# Patient Record
Sex: Female | Born: 1950 | Race: White | Hispanic: No | Marital: Single | Smoking: Current every day smoker
Health system: Southern US, Community
[De-identification: ages and names within clinical notes are randomized; demographics above are authoritative.]

---

## 2004-10-02 ENCOUNTER — Ambulatory Visit: Payer: Self-pay | Admitting: Family Medicine

## 2005-04-26 ENCOUNTER — Emergency Department: Payer: Self-pay | Admitting: Emergency Medicine

## 2008-10-16 ENCOUNTER — Ambulatory Visit: Payer: Self-pay

## 2009-04-16 ENCOUNTER — Ambulatory Visit: Payer: Self-pay

## 2014-09-19 ENCOUNTER — Ambulatory Visit: Payer: Self-pay | Admitting: Family Medicine

## 2015-05-07 ENCOUNTER — Other Ambulatory Visit: Payer: Self-pay | Admitting: Family Medicine

## 2015-05-07 DIAGNOSIS — Z1231 Encounter for screening mammogram for malignant neoplasm of breast: Secondary | ICD-10-CM

## 2015-09-22 ENCOUNTER — Ambulatory Visit: Payer: Self-pay

## 2015-09-25 ENCOUNTER — Ambulatory Visit
Admission: RE | Admit: 2015-09-25 | Discharge: 2015-09-25 | Disposition: A | Discharge status: Home / Self Care | Payer: Medicare Other | Source: Ambulatory Visit | Attending: Family Medicine | Admitting: Family Medicine

## 2015-09-25 DIAGNOSIS — Z1231 Encounter for screening mammogram for malignant neoplasm of breast: Secondary | ICD-10-CM | POA: Diagnosis present

## 2015-11-26 ENCOUNTER — Other Ambulatory Visit: Payer: Self-pay | Admitting: Family Medicine

## 2015-11-26 DIAGNOSIS — Z13820 Encounter for screening for osteoporosis: Secondary | ICD-10-CM

## 2016-09-06 DIAGNOSIS — Z0000 Encounter for general adult medical examination without abnormal findings: Secondary | ICD-10-CM | POA: Diagnosis not present

## 2016-09-06 DIAGNOSIS — E7800 Pure hypercholesterolemia, unspecified: Secondary | ICD-10-CM | POA: Diagnosis not present

## 2016-09-06 DIAGNOSIS — F526 Dyspareunia not due to a substance or known physiological condition: Secondary | ICD-10-CM | POA: Diagnosis not present

## 2016-09-06 DIAGNOSIS — Z13820 Encounter for screening for osteoporosis: Secondary | ICD-10-CM | POA: Diagnosis not present

## 2016-09-06 DIAGNOSIS — Z23 Encounter for immunization: Secondary | ICD-10-CM | POA: Diagnosis not present

## 2016-09-06 DIAGNOSIS — Z1231 Encounter for screening mammogram for malignant neoplasm of breast: Secondary | ICD-10-CM | POA: Diagnosis not present

## 2016-09-06 DIAGNOSIS — Z87891 Personal history of nicotine dependence: Secondary | ICD-10-CM | POA: Diagnosis not present

## 2016-09-06 DIAGNOSIS — R87610 Atypical squamous cells of undetermined significance on cytologic smear of cervix (ASC-US): Secondary | ICD-10-CM | POA: Diagnosis not present

## 2016-09-06 DIAGNOSIS — D229 Melanocytic nevi, unspecified: Secondary | ICD-10-CM | POA: Diagnosis not present

## 2016-09-07 ENCOUNTER — Other Ambulatory Visit: Payer: Self-pay | Admitting: Family Medicine

## 2016-09-07 DIAGNOSIS — Z1231 Encounter for screening mammogram for malignant neoplasm of breast: Secondary | ICD-10-CM

## 2016-09-16 DIAGNOSIS — B977 Papillomavirus as the cause of diseases classified elsewhere: Secondary | ICD-10-CM | POA: Diagnosis not present

## 2016-09-16 DIAGNOSIS — N941 Unspecified dyspareunia: Secondary | ICD-10-CM | POA: Diagnosis not present

## 2016-10-05 DIAGNOSIS — L570 Actinic keratosis: Secondary | ICD-10-CM | POA: Diagnosis not present

## 2016-10-05 DIAGNOSIS — L821 Other seborrheic keratosis: Secondary | ICD-10-CM | POA: Diagnosis not present

## 2016-10-05 DIAGNOSIS — L820 Inflamed seborrheic keratosis: Secondary | ICD-10-CM | POA: Diagnosis not present

## 2016-10-05 DIAGNOSIS — D229 Melanocytic nevi, unspecified: Secondary | ICD-10-CM | POA: Diagnosis not present

## 2016-10-21 DIAGNOSIS — N941 Unspecified dyspareunia: Secondary | ICD-10-CM | POA: Diagnosis not present

## 2016-10-21 DIAGNOSIS — N83201 Unspecified ovarian cyst, right side: Secondary | ICD-10-CM | POA: Diagnosis not present

## 2016-10-27 ENCOUNTER — Ambulatory Visit
Admission: RE | Admit: 2016-10-27 | Discharge: 2016-10-27 | Disposition: A | Discharge status: Home / Self Care | Payer: Medicare Other | Source: Ambulatory Visit | Attending: Family Medicine | Admitting: Family Medicine

## 2016-10-27 DIAGNOSIS — Z1231 Encounter for screening mammogram for malignant neoplasm of breast: Secondary | ICD-10-CM | POA: Diagnosis not present

## 2016-10-27 DIAGNOSIS — Z13820 Encounter for screening for osteoporosis: Secondary | ICD-10-CM | POA: Insufficient documentation

## 2016-10-27 DIAGNOSIS — M810 Age-related osteoporosis without current pathological fracture: Secondary | ICD-10-CM | POA: Insufficient documentation

## 2016-10-27 DIAGNOSIS — M818 Other osteoporosis without current pathological fracture: Secondary | ICD-10-CM | POA: Diagnosis not present

## 2016-11-09 DIAGNOSIS — R05 Cough: Secondary | ICD-10-CM | POA: Diagnosis not present

## 2016-11-09 DIAGNOSIS — J22 Unspecified acute lower respiratory infection: Secondary | ICD-10-CM | POA: Diagnosis not present

## 2016-11-09 DIAGNOSIS — Z87891 Personal history of nicotine dependence: Secondary | ICD-10-CM | POA: Diagnosis not present

## 2016-11-09 DIAGNOSIS — M810 Age-related osteoporosis without current pathological fracture: Secondary | ICD-10-CM | POA: Diagnosis not present

## 2016-12-09 DIAGNOSIS — Z87891 Personal history of nicotine dependence: Secondary | ICD-10-CM | POA: Diagnosis not present

## 2016-12-09 DIAGNOSIS — R05 Cough: Secondary | ICD-10-CM | POA: Diagnosis not present

## 2016-12-09 DIAGNOSIS — R938 Abnormal findings on diagnostic imaging of other specified body structures: Secondary | ICD-10-CM | POA: Diagnosis not present

## 2017-01-03 DIAGNOSIS — R938 Abnormal findings on diagnostic imaging of other specified body structures: Secondary | ICD-10-CM | POA: Diagnosis not present

## 2017-01-03 DIAGNOSIS — N83201 Unspecified ovarian cyst, right side: Secondary | ICD-10-CM | POA: Diagnosis not present

## 2017-05-18 DIAGNOSIS — Z23 Encounter for immunization: Secondary | ICD-10-CM | POA: Diagnosis not present

## 2017-06-06 DIAGNOSIS — J209 Acute bronchitis, unspecified: Secondary | ICD-10-CM | POA: Diagnosis not present

## 2017-08-04 DIAGNOSIS — N83201 Unspecified ovarian cyst, right side: Secondary | ICD-10-CM | POA: Diagnosis not present

## 2017-09-08 DIAGNOSIS — M810 Age-related osteoporosis without current pathological fracture: Secondary | ICD-10-CM | POA: Diagnosis not present

## 2017-09-08 DIAGNOSIS — B029 Zoster without complications: Secondary | ICD-10-CM | POA: Diagnosis not present

## 2017-09-08 DIAGNOSIS — Z0000 Encounter for general adult medical examination without abnormal findings: Secondary | ICD-10-CM | POA: Diagnosis not present

## 2017-09-08 DIAGNOSIS — Z87891 Personal history of nicotine dependence: Secondary | ICD-10-CM | POA: Diagnosis not present

## 2017-09-16 ENCOUNTER — Other Ambulatory Visit: Payer: Self-pay | Admitting: Family Medicine

## 2017-09-16 DIAGNOSIS — Z1231 Encounter for screening mammogram for malignant neoplasm of breast: Secondary | ICD-10-CM

## 2017-10-28 ENCOUNTER — Ambulatory Visit
Admission: RE | Admit: 2017-10-28 | Discharge: 2017-10-28 | Disposition: A | Discharge status: Home / Self Care | Payer: Medicare Other | Source: Ambulatory Visit | Attending: Family Medicine | Admitting: Family Medicine

## 2017-10-28 DIAGNOSIS — R921 Mammographic calcification found on diagnostic imaging of breast: Secondary | ICD-10-CM | POA: Insufficient documentation

## 2017-10-28 DIAGNOSIS — Z1231 Encounter for screening mammogram for malignant neoplasm of breast: Secondary | ICD-10-CM | POA: Diagnosis not present

## 2017-11-01 ENCOUNTER — Other Ambulatory Visit: Payer: Self-pay | Admitting: Family Medicine

## 2017-11-01 DIAGNOSIS — R921 Mammographic calcification found on diagnostic imaging of breast: Secondary | ICD-10-CM

## 2017-11-01 DIAGNOSIS — R928 Other abnormal and inconclusive findings on diagnostic imaging of breast: Secondary | ICD-10-CM

## 2017-11-08 ENCOUNTER — Ambulatory Visit
Admission: RE | Admit: 2017-11-08 | Discharge: 2017-11-08 | Disposition: A | Discharge status: Home / Self Care | Payer: Medicare Other | Source: Ambulatory Visit | Attending: Family Medicine | Admitting: Family Medicine

## 2017-11-08 DIAGNOSIS — R921 Mammographic calcification found on diagnostic imaging of breast: Secondary | ICD-10-CM

## 2017-11-08 DIAGNOSIS — R928 Other abnormal and inconclusive findings on diagnostic imaging of breast: Secondary | ICD-10-CM | POA: Insufficient documentation

## 2017-11-14 ENCOUNTER — Other Ambulatory Visit: Payer: Self-pay | Admitting: Family Medicine

## 2017-11-14 DIAGNOSIS — N6320 Unspecified lump in the left breast, unspecified quadrant: Secondary | ICD-10-CM | POA: Diagnosis not present

## 2017-11-14 DIAGNOSIS — R928 Other abnormal and inconclusive findings on diagnostic imaging of breast: Secondary | ICD-10-CM | POA: Diagnosis not present

## 2017-11-14 DIAGNOSIS — M810 Age-related osteoporosis without current pathological fracture: Secondary | ICD-10-CM | POA: Diagnosis not present

## 2017-11-14 DIAGNOSIS — R921 Mammographic calcification found on diagnostic imaging of breast: Secondary | ICD-10-CM

## 2017-11-14 DIAGNOSIS — Z87891 Personal history of nicotine dependence: Secondary | ICD-10-CM | POA: Diagnosis not present

## 2017-11-15 ENCOUNTER — Telehealth: Payer: Self-pay | Admitting: *Deleted

## 2017-11-15 NOTE — Telephone Encounter (Signed)
Received referral for initial lung cancer screening scan. Contacted patient who is having breast finding evaluated. Discussed screening with patient and will follow up after procedure for breast biopsy.

## 2017-11-22 ENCOUNTER — Ambulatory Visit: Payer: Medicare Other

## 2017-12-01 ENCOUNTER — Ambulatory Visit
Admission: RE | Admit: 2017-12-01 | Discharge: 2017-12-01 | Disposition: A | Discharge status: Home / Self Care | Payer: Medicare Other | Source: Ambulatory Visit | Attending: Family Medicine | Admitting: Family Medicine

## 2017-12-01 DIAGNOSIS — R921 Mammographic calcification found on diagnostic imaging of breast: Secondary | ICD-10-CM | POA: Diagnosis not present

## 2017-12-01 DIAGNOSIS — N6012 Diffuse cystic mastopathy of left breast: Secondary | ICD-10-CM | POA: Diagnosis not present

## 2017-12-01 DIAGNOSIS — R928 Other abnormal and inconclusive findings on diagnostic imaging of breast: Secondary | ICD-10-CM | POA: Insufficient documentation

## 2017-12-01 HISTORY — PX: BREAST BIOPSY: SHX20

## 2017-12-02 LAB — SURGICAL PATHOLOGY

## 2017-12-07 ENCOUNTER — Telehealth: Payer: Self-pay | Admitting: *Deleted

## 2017-12-07 DIAGNOSIS — Z87891 Personal history of nicotine dependence: Secondary | ICD-10-CM

## 2017-12-07 DIAGNOSIS — Z122 Encounter for screening for malignant neoplasm of respiratory organs: Secondary | ICD-10-CM

## 2017-12-07 NOTE — Telephone Encounter (Signed)
Received referral for low dose lung cancer screening CT scan. Message left at phone number listed in EMR for patient to call me back to facilitate scheduling scan.  

## 2017-12-07 NOTE — Telephone Encounter (Signed)
Received referral for initial lung cancer screening scan. Contacted patient and obtained smoking history,(current, 42.75 pack year) as well as answering questions related to screening process. Patient denies signs of lung cancer such as weight loss or hemoptysis. Patient denies comorbidity that would prevent curative treatment if lung cancer were found. Patient is scheduled for shared decision making visit and CT scan on 12/27/17.

## 2017-12-27 ENCOUNTER — Ambulatory Visit
Admission: RE | Admit: 2017-12-27 | Discharge: 2017-12-27 | Disposition: A | Discharge status: Home / Self Care | Payer: Medicare Other | Source: Ambulatory Visit | Attending: Oncology | Admitting: Oncology

## 2017-12-27 ENCOUNTER — Inpatient Hospital Stay: Payer: Medicare Other | Attending: Oncology | Admitting: Oncology

## 2017-12-27 ENCOUNTER — Encounter: Payer: Self-pay | Admitting: Oncology

## 2017-12-27 DIAGNOSIS — F17210 Nicotine dependence, cigarettes, uncomplicated: Secondary | ICD-10-CM | POA: Diagnosis not present

## 2017-12-27 DIAGNOSIS — I2510 Atherosclerotic heart disease of native coronary artery without angina pectoris: Secondary | ICD-10-CM | POA: Insufficient documentation

## 2017-12-27 DIAGNOSIS — J439 Emphysema, unspecified: Secondary | ICD-10-CM | POA: Diagnosis not present

## 2017-12-27 DIAGNOSIS — I700 Atherosclerosis of aorta: Secondary | ICD-10-CM | POA: Diagnosis not present

## 2017-12-27 DIAGNOSIS — Z87891 Personal history of nicotine dependence: Secondary | ICD-10-CM

## 2017-12-27 DIAGNOSIS — Z122 Encounter for screening for malignant neoplasm of respiratory organs: Secondary | ICD-10-CM | POA: Diagnosis present

## 2017-12-27 NOTE — Progress Notes (Signed)
In accordance with CMS guidelines, patient has met eligibility criteria including age, absence of signs or symptoms of lung cancer.  Social History   Tobacco Use  . Smoking status: Current Every Day Smoker    Packs/day: 0.75    Years: 57.00    Pack years: 42.75    Types: Cigarettes  Substance Use Topics  . Alcohol use: Not on file  . Drug use: Not on file     A shared decision-making session was conducted prior to the performance of CT scan. This includes one or more decision aids, includes benefits and harms of screening, follow-up diagnostic testing, over-diagnosis, false positive rate, and total radiation exposure.  Counseling on the importance of adherence to annual lung cancer LDCT screening, impact of co-morbidities, and ability or willingness to undergo diagnosis and treatment is imperative for compliance of the program.  Counseling on the importance of continued smoking cessation for former smokers; the importance of smoking cessation for current smokers, and information about tobacco cessation interventions have been given to patient including  Quit Smart and 1800 quit [place] programs.  Written order for lung cancer screening with LDCT has been given to the patient and any and all questions have been answered to the best of my abilities.   Yearly follow up will be coordinated by Shawn Perkins, Thoracic Navigator.  Jennifer Burns, NP 12/27/2017 11:09 AM  

## 2018-01-02 ENCOUNTER — Encounter: Payer: Self-pay | Admitting: *Deleted

## 2018-01-31 DIAGNOSIS — J431 Panlobular emphysema: Secondary | ICD-10-CM | POA: Diagnosis not present

## 2018-01-31 DIAGNOSIS — M810 Age-related osteoporosis without current pathological fracture: Secondary | ICD-10-CM | POA: Diagnosis not present

## 2018-01-31 DIAGNOSIS — Z87891 Personal history of nicotine dependence: Secondary | ICD-10-CM | POA: Diagnosis not present

## 2018-01-31 DIAGNOSIS — I2510 Atherosclerotic heart disease of native coronary artery without angina pectoris: Secondary | ICD-10-CM | POA: Diagnosis not present

## 2018-05-31 DIAGNOSIS — Z23 Encounter for immunization: Secondary | ICD-10-CM | POA: Diagnosis not present

## 2018-06-08 DIAGNOSIS — R3989 Other symptoms and signs involving the genitourinary system: Secondary | ICD-10-CM | POA: Diagnosis not present

## 2018-06-08 DIAGNOSIS — N3000 Acute cystitis without hematuria: Secondary | ICD-10-CM | POA: Diagnosis not present

## 2018-06-08 DIAGNOSIS — M533 Sacrococcygeal disorders, not elsewhere classified: Secondary | ICD-10-CM | POA: Diagnosis not present

## 2018-06-13 DIAGNOSIS — R399 Unspecified symptoms and signs involving the genitourinary system: Secondary | ICD-10-CM | POA: Diagnosis not present

## 2018-06-13 DIAGNOSIS — R319 Hematuria, unspecified: Secondary | ICD-10-CM | POA: Diagnosis not present

## 2018-06-13 DIAGNOSIS — R1032 Left lower quadrant pain: Secondary | ICD-10-CM | POA: Diagnosis not present

## 2018-06-14 DIAGNOSIS — R1032 Left lower quadrant pain: Secondary | ICD-10-CM | POA: Diagnosis not present

## 2018-06-14 DIAGNOSIS — R319 Hematuria, unspecified: Secondary | ICD-10-CM | POA: Diagnosis not present

## 2018-08-03 DIAGNOSIS — I2583 Coronary atherosclerosis due to lipid rich plaque: Secondary | ICD-10-CM | POA: Diagnosis not present

## 2018-08-03 DIAGNOSIS — K635 Polyp of colon: Secondary | ICD-10-CM | POA: Diagnosis not present

## 2018-08-03 DIAGNOSIS — I2510 Atherosclerotic heart disease of native coronary artery without angina pectoris: Secondary | ICD-10-CM | POA: Diagnosis not present

## 2018-08-03 DIAGNOSIS — M810 Age-related osteoporosis without current pathological fracture: Secondary | ICD-10-CM | POA: Diagnosis not present

## 2018-08-03 DIAGNOSIS — Z1231 Encounter for screening mammogram for malignant neoplasm of breast: Secondary | ICD-10-CM | POA: Diagnosis not present

## 2018-08-03 DIAGNOSIS — J431 Panlobular emphysema: Secondary | ICD-10-CM | POA: Diagnosis not present

## 2018-08-24 ENCOUNTER — Other Ambulatory Visit: Payer: Self-pay | Admitting: Family Medicine

## 2018-08-24 DIAGNOSIS — Z1231 Encounter for screening mammogram for malignant neoplasm of breast: Secondary | ICD-10-CM

## 2018-08-31 DIAGNOSIS — R399 Unspecified symptoms and signs involving the genitourinary system: Secondary | ICD-10-CM | POA: Diagnosis not present

## 2018-09-04 DIAGNOSIS — R140 Abdominal distension (gaseous): Secondary | ICD-10-CM | POA: Diagnosis not present

## 2018-09-04 DIAGNOSIS — I70209 Unspecified atherosclerosis of native arteries of extremities, unspecified extremity: Secondary | ICD-10-CM | POA: Diagnosis not present

## 2018-09-04 DIAGNOSIS — I700 Atherosclerosis of aorta: Secondary | ICD-10-CM | POA: Diagnosis not present

## 2018-09-04 DIAGNOSIS — R1032 Left lower quadrant pain: Secondary | ICD-10-CM | POA: Diagnosis not present

## 2018-09-04 DIAGNOSIS — K5730 Diverticulosis of large intestine without perforation or abscess without bleeding: Secondary | ICD-10-CM | POA: Diagnosis not present

## 2018-09-04 DIAGNOSIS — I708 Atherosclerosis of other arteries: Secondary | ICD-10-CM | POA: Diagnosis not present

## 2018-09-04 DIAGNOSIS — K7689 Other specified diseases of liver: Secondary | ICD-10-CM | POA: Diagnosis not present

## 2018-09-04 DIAGNOSIS — K635 Polyp of colon: Secondary | ICD-10-CM | POA: Diagnosis not present

## 2018-10-02 DIAGNOSIS — I2583 Coronary atherosclerosis due to lipid rich plaque: Secondary | ICD-10-CM | POA: Diagnosis not present

## 2018-10-02 DIAGNOSIS — I2510 Atherosclerotic heart disease of native coronary artery without angina pectoris: Secondary | ICD-10-CM | POA: Diagnosis not present

## 2018-10-19 ENCOUNTER — Other Ambulatory Visit: Payer: Self-pay | Admitting: Family Medicine

## 2018-10-19 DIAGNOSIS — M810 Age-related osteoporosis without current pathological fracture: Secondary | ICD-10-CM

## 2019-01-04 ENCOUNTER — Ambulatory Visit
Admission: RE | Admit: 2019-01-04 | Discharge: 2019-01-04 | Disposition: A | Discharge status: Home / Self Care | Payer: Medicare Other | Source: Ambulatory Visit | Attending: Family Medicine | Admitting: Family Medicine

## 2019-01-04 ENCOUNTER — Other Ambulatory Visit: Payer: Self-pay

## 2019-01-04 DIAGNOSIS — Z1231 Encounter for screening mammogram for malignant neoplasm of breast: Secondary | ICD-10-CM | POA: Diagnosis not present

## 2019-01-08 DIAGNOSIS — E7800 Pure hypercholesterolemia, unspecified: Secondary | ICD-10-CM | POA: Diagnosis not present

## 2019-01-08 DIAGNOSIS — R87613 High grade squamous intraepithelial lesion on cytologic smear of cervix (HGSIL): Secondary | ICD-10-CM | POA: Diagnosis not present

## 2019-01-08 DIAGNOSIS — Z0000 Encounter for general adult medical examination without abnormal findings: Secondary | ICD-10-CM | POA: Diagnosis not present

## 2019-01-08 DIAGNOSIS — Z23 Encounter for immunization: Secondary | ICD-10-CM | POA: Diagnosis not present

## 2019-01-12 ENCOUNTER — Telehealth: Payer: Self-pay

## 2019-01-12 NOTE — Telephone Encounter (Signed)
Call pt regarding lung screening. Pt want to speak with Shawn before scheduling.

## 2019-01-15 ENCOUNTER — Telehealth: Payer: Self-pay | Admitting: *Deleted

## 2019-01-15 DIAGNOSIS — Z87891 Personal history of nicotine dependence: Secondary | ICD-10-CM

## 2019-01-15 DIAGNOSIS — Z122 Encounter for screening for malignant neoplasm of respiratory organs: Secondary | ICD-10-CM

## 2019-01-15 NOTE — Telephone Encounter (Signed)
Patient has been notified that annual lung cancer screening low dose CT scan is due currently or will be in near future. Confirmed that patient is within the age range of 55-77, and asymptomatic, (no signs or symptoms of lung cancer). Patient denies illness that would prevent curative treatment for lung cancer if found. Verified smoking history, (current, 43.5 pack year). The shared decision making visit was done 12/27/17. Patient is agreeable for CT scan being scheduled.

## 2019-01-22 ENCOUNTER — Other Ambulatory Visit: Payer: Self-pay

## 2019-01-22 ENCOUNTER — Ambulatory Visit
Admission: RE | Admit: 2019-01-22 | Discharge: 2019-01-22 | Disposition: A | Discharge status: Home / Self Care | Payer: Medicare Other | Source: Ambulatory Visit | Attending: Nurse Practitioner | Admitting: Nurse Practitioner

## 2019-01-22 DIAGNOSIS — F17210 Nicotine dependence, cigarettes, uncomplicated: Secondary | ICD-10-CM | POA: Diagnosis not present

## 2019-01-22 DIAGNOSIS — Z87891 Personal history of nicotine dependence: Secondary | ICD-10-CM | POA: Insufficient documentation

## 2019-01-22 DIAGNOSIS — Z122 Encounter for screening for malignant neoplasm of respiratory organs: Secondary | ICD-10-CM | POA: Diagnosis not present

## 2019-01-24 ENCOUNTER — Encounter: Payer: Self-pay | Admitting: *Deleted

## 2019-02-07 DIAGNOSIS — Z01812 Encounter for preprocedural laboratory examination: Secondary | ICD-10-CM | POA: Diagnosis not present

## 2019-02-07 DIAGNOSIS — Z1159 Encounter for screening for other viral diseases: Secondary | ICD-10-CM | POA: Diagnosis not present

## 2019-02-09 DIAGNOSIS — D369 Benign neoplasm, unspecified site: Secondary | ICD-10-CM | POA: Diagnosis not present

## 2019-02-09 DIAGNOSIS — E785 Hyperlipidemia, unspecified: Secondary | ICD-10-CM | POA: Diagnosis not present

## 2019-02-09 DIAGNOSIS — Z79899 Other long term (current) drug therapy: Secondary | ICD-10-CM | POA: Diagnosis not present

## 2019-02-09 DIAGNOSIS — K635 Polyp of colon: Secondary | ICD-10-CM | POA: Diagnosis not present

## 2019-02-09 DIAGNOSIS — K5730 Diverticulosis of large intestine without perforation or abscess without bleeding: Secondary | ICD-10-CM | POA: Diagnosis not present

## 2019-02-09 DIAGNOSIS — D122 Benign neoplasm of ascending colon: Secondary | ICD-10-CM | POA: Diagnosis not present

## 2019-02-09 DIAGNOSIS — Z1211 Encounter for screening for malignant neoplasm of colon: Secondary | ICD-10-CM | POA: Diagnosis not present

## 2019-02-09 DIAGNOSIS — Z86010 Personal history of colonic polyps: Secondary | ICD-10-CM | POA: Diagnosis not present

## 2019-02-09 DIAGNOSIS — D124 Benign neoplasm of descending colon: Secondary | ICD-10-CM | POA: Diagnosis not present

## 2019-02-09 DIAGNOSIS — Z791 Long term (current) use of non-steroidal anti-inflammatories (NSAID): Secondary | ICD-10-CM | POA: Diagnosis not present

## 2019-02-09 DIAGNOSIS — Z7982 Long term (current) use of aspirin: Secondary | ICD-10-CM | POA: Diagnosis not present

## 2019-06-25 DIAGNOSIS — Z23 Encounter for immunization: Secondary | ICD-10-CM | POA: Diagnosis not present

## 2019-09-19 DIAGNOSIS — I2510 Atherosclerotic heart disease of native coronary artery without angina pectoris: Secondary | ICD-10-CM | POA: Diagnosis not present

## 2019-09-19 DIAGNOSIS — Z87891 Personal history of nicotine dependence: Secondary | ICD-10-CM | POA: Diagnosis not present

## 2019-09-19 DIAGNOSIS — F5104 Psychophysiologic insomnia: Secondary | ICD-10-CM | POA: Diagnosis not present

## 2019-09-19 DIAGNOSIS — R399 Unspecified symptoms and signs involving the genitourinary system: Secondary | ICD-10-CM | POA: Diagnosis not present

## 2019-09-19 DIAGNOSIS — I2583 Coronary atherosclerosis due to lipid rich plaque: Secondary | ICD-10-CM | POA: Diagnosis not present

## 2019-12-17 ENCOUNTER — Other Ambulatory Visit: Payer: Self-pay

## 2019-12-17 ENCOUNTER — Ambulatory Visit
Admission: EM | Admit: 2019-12-17 | Discharge: 2019-12-17 | Disposition: A | Discharge status: Home / Self Care | Payer: Medicare Other | Attending: Family Medicine | Admitting: Family Medicine

## 2019-12-17 DIAGNOSIS — R319 Hematuria, unspecified: Secondary | ICD-10-CM | POA: Diagnosis not present

## 2019-12-17 DIAGNOSIS — N390 Urinary tract infection, site not specified: Secondary | ICD-10-CM | POA: Insufficient documentation

## 2019-12-17 DIAGNOSIS — B373 Candidiasis of vulva and vagina: Secondary | ICD-10-CM | POA: Insufficient documentation

## 2019-12-17 DIAGNOSIS — B3731 Acute candidiasis of vulva and vagina: Secondary | ICD-10-CM

## 2019-12-17 LAB — URINALYSIS, COMPLETE (UACMP) WITH MICROSCOPIC
Bilirubin Urine: NEGATIVE
Glucose, UA: NEGATIVE mg/dL
Ketones, ur: NEGATIVE mg/dL
Nitrite: NEGATIVE
Protein, ur: 30 mg/dL — AB
Specific Gravity, Urine: 1.015 (ref 1.005–1.030)
Squamous Epithelial / LPF: NONE SEEN (ref 0–5)
pH: >9 — ABNORMAL HIGH (ref 5.0–8.0)

## 2019-12-17 MED ORDER — CEPHALEXIN 500 MG PO CAPS: 500.0000 mg | ORAL_CAPSULE | Freq: Two times a day (BID) | ORAL | 0 refills | Status: AC

## 2019-12-17 MED ORDER — FLUCONAZOLE 150 MG PO TABS: 150.0000 mg | ORAL_TABLET | Freq: Every day | ORAL | 0 refills | Status: DC

## 2019-12-17 NOTE — ED Provider Notes (Signed)
[facility] ____________________________________________  Time seen: Approximately 10:59 AM  I have reviewed the triage vital signs and the nursing notes.   HISTORY  Chief Complaint Dysuria   HPI ALANY BORMAN is a 69 y.o. female presenting for evaluation of urinary frequency, urinary urgency and burning with urination since yesterday.  Also noticed foul odor to urine.  States this feels like previous UTIs.  Denies vaginal discharge or itching.  States some pressure in lower mid abdomen, denies other abdominal pain.  Denies fevers, vomiting, diarrhea, back pain.  Continues eat and drink well.  Denies aggravating or alleviating factors.  Cathie Hoops, [place]: PCP   No past medical history on file.  There are no problems to display for this patient.   Past Surgical History:  Procedure Laterality Date  . BREAST BIOPSY Left 12/01/2017   FIBROADENOMATOUS CHANGE WITH CALCIFICATIONS     No current facility-administered medications for this encounter.  Current Outpatient Medications:  .  aspirin 81 MG EC tablet, Take by mouth., Disp: , Rfl:  .  buPROPion (WELLBUTRIN XL) 150 MG 24 hr tablet, Take 150 mg by mouth daily., Disp: , Rfl:  .  cephALEXin (KEFLEX) 500 MG capsule, Take 1 capsule (500 mg total) by mouth 2 (two) times daily for 7 days., Disp: 14 capsule, Rfl: 0 .  fluconazole (DIFLUCAN) 150 MG tablet, Take 1 tablet (150 mg total) by mouth daily. Take one pill orally, then Repeat in one week as needed., Disp: 2 tablet, Rfl: 0  Allergies Patient has no known allergies.  Family History  Problem Relation Age of Onset  . Lung cancer Father   . Breast cancer Sister 1  . Lung cancer Brother   . Hodgkin's lymphoma Brother   . Breast cancer Sister 80    Social History Social History   Tobacco Use  . Smoking status: Current Every Day Smoker    Packs/day: 0.75    Years: 57.00    Pack years: 42.75    Types: Cigarettes  Substance Use Topics  . Alcohol  use: Not on file  . Drug use: Not on file    Review of Systems Constitutional: No fever Cardiovascular: Denies chest pain. Respiratory: Denies shortness of breath. Gastrointestinal: No abdominal pain.  No nausea, no vomiting.  No diarrhea.  Genitourinary: Positive for dysuria. Musculoskeletal: Negative for back pain. Skin: Negative for rash.   ____________________________________________   PHYSICAL EXAM:  VITAL SIGNS: ED Triage Vitals [12/17/19 0952]  Enc Vitals Group     BP 140/69     Pulse Rate 64     Resp 16     Temp 98 F (36.7 C)     Temp src      SpO2 99 %     Weight      Height      Head Circumference      Peak Flow      Pain Score 9     Pain Loc      Pain Edu?      Excl. in GC?     Constitutional: Alert and oriented. Well appearing and in no acute distress. Eyes: Conjunctivae are normal.  ENT      Head: Normocephalic and atraumatic. Cardiovascular: Normal rate, regular rhythm. Grossly normal heart sounds.  Good peripheral circulation. Respiratory: Normal respiratory effort without tachypnea nor retractions. Breath sounds are clear and equal bilaterally. No wheezes, rales, rhonchi. Gastrointestinal: No midline suprapubic tenderness palpation.  Abdomen otherwise soft nontender.  No CVA tenderness.  Musculoskeletal: Steady gait. Neurologic:  Normal speech and language. Speech is normal. No gait instability.  Skin:  Skin is warm, dry and intact. No rash noted. Psychiatric: Mood and affect are normal. Speech and behavior are normal. Patient exhibits appropriate insight and judgment   ___________________________________________   LABS (all labs ordered are listed, but only abnormal results are displayed)  Labs Reviewed  URINALYSIS, COMPLETE (UACMP) WITH MICROSCOPIC - Abnormal; Notable for the following components:      Result Value   APPearance CLOUDY (*)    pH >9.0 (*)    Hgb urine dipstick TRACE (*)    Protein, ur 30 (*)    Leukocytes,Ua SMALL (*)     Bacteria, UA MANY (*)    All other components within normal limits  URINE CULTURE     PROCEDURES Procedures    INITIAL IMPRESSION / ASSESSMENT AND PLAN / ED COURSE  Pertinent labs & imaging results that were available during my care of the patient were reviewed by me and considered in my medical decision making (see chart for details).  Well-appearing patient.  No acute distress.  Dysuria complaints.  Urinalysis reviewed, urinary tract infection present.  Yeast also noted.  We will culture urine.  Will empirically treat with oral Keflex, Diflucan once a day and repeat in 1 week.  Encourage rest, fluids, supportive care and encouraged to schedule primary care follow-up. Discussed indication, risks and benefits of medications with patient.   Discussed follow up with Primary care physician this week. Discussed follow up and return parameters including no resolution or any worsening concerns. Patient verbalized understanding and agreed to plan.   ____________________________________________   FINAL CLINICAL IMPRESSION(S) / ED DIAGNOSES  Final diagnoses:  Urinary tract infection with hematuria, site unspecified  Yeast vaginitis     ED Discharge Orders         Ordered    cephALEXin (KEFLEX) 500 MG capsule  2 times daily     12/17/19 1024    fluconazole (DIFLUCAN) 150 MG tablet  Daily     12/17/19 1024           Note: This dictation was prepared with Dragon dictation along with smaller phrase technology. Any transcriptional errors that result from this process are unintentional.         Marylene Land, NP 12/17/19 1103

## 2019-12-17 NOTE — Discharge Instructions (Signed)
Take medication as prescribed. Rest. Drink plenty of fluids.  ° °Follow up with your primary care physician this week as needed. Return to Urgent care for new or worsening concerns.  ° °

## 2019-12-17 NOTE — ED Triage Notes (Addendum)
Pt c/o burning with urination and lower abdominal pressure since yesterday. Pt had UTI in March, given macrobid at that time. "It feels like I never get rid of one before I get another."

## 2019-12-20 LAB — URINE CULTURE: Culture: >=100000 — AB

## 2019-12-29 ENCOUNTER — Ambulatory Visit
Admission: EM | Admit: 2019-12-29 | Discharge: 2019-12-29 | Disposition: A | Discharge status: Home / Self Care | Payer: Medicare Other | Attending: Emergency Medicine | Admitting: Emergency Medicine

## 2019-12-29 ENCOUNTER — Other Ambulatory Visit: Payer: Self-pay

## 2019-12-29 ENCOUNTER — Encounter: Payer: Self-pay | Admitting: Emergency Medicine

## 2019-12-29 DIAGNOSIS — N390 Urinary tract infection, site not specified: Secondary | ICD-10-CM | POA: Diagnosis not present

## 2019-12-29 LAB — URINALYSIS, COMPLETE (UACMP) WITH MICROSCOPIC
Bilirubin Urine: NEGATIVE
Glucose, UA: NEGATIVE mg/dL
Ketones, ur: NEGATIVE mg/dL
Nitrite: NEGATIVE
Protein, ur: 30 mg/dL — AB
Specific Gravity, Urine: 1.02 (ref 1.005–1.030)
WBC, UA: >50 WBC/hpf (ref 0–5)
pH: 6 (ref 5.0–8.0)

## 2019-12-29 MED ORDER — PHENAZOPYRIDINE HCL 200 MG PO TABS: 200.0000 mg | ORAL_TABLET | Freq: Three times a day (TID) | ORAL | 0 refills | Status: DC

## 2019-12-29 MED ORDER — CEPHALEXIN 500 MG PO CAPS: 500.0000 mg | ORAL_CAPSULE | Freq: Two times a day (BID) | ORAL | 0 refills | Status: DC

## 2019-12-29 NOTE — ED Triage Notes (Signed)
Patient c/o burning when urinating that started on Thursday.  Patient also reports lower back pain.

## 2019-12-29 NOTE — Discharge Instructions (Addendum)
Drink plenty of fluids.  Discuss with your primary care physician at your upcoming appointment next month regarding recurrent UTIs.

## 2019-12-29 NOTE — ED Provider Notes (Signed)
[facility]    CSN: [id number] Arrival date & time: 12/29/19  6195      History   Chief Complaint Chief Complaint  Patient presents with   Dysuria    HPI Candace Andrews is a 69 y.o. female.   HPI  69 year old female presents with burning with urination that started on Thursday 2 days prior to this visit.  She also reports lower back pain.  She has had no fever or chills but has noticed sweating on the back of her head when sleeping.  She has no nausea or vomiting.  She denies any vaginal discharge.  View of her medical records reveals a UTI on 09/19/2019 seen at the [facility] and also 12/17/2019 treated here.  Her last urine culture was positive for Proteus and treated with Keflex.  She did improve until recently when the symptoms seem to return.  States that she is doing all the proper activities to avoid recurrent UTIs.  She is drinking plenty of water.        History reviewed. No pertinent past medical history.  There are no problems to display for this patient.   Past Surgical History:  Procedure Laterality Date   BREAST BIOPSY Left 12/01/2017   FIBROADENOMATOUS CHANGE WITH CALCIFICATIONS    OB History   No obstetric history on file.      Home Medications    Prior to Admission medications   Medication Sig Start Date End Date Taking? Authorizing Provider  aspirin 81 MG EC tablet Take by mouth.   Yes [provider]  buPROPion (WELLBUTRIN XL) 150 MG 24 hr tablet Take 150 mg by mouth daily. 11/30/19  Yes [provider]  cephALEXin (KEFLEX) 500 MG capsule Take 1 capsule (500 mg total) by mouth 2 (two) times daily. 12/29/19   Lutricia Feil, PA-C  phenazopyridine (PYRIDIUM) 200 MG tablet Take 1 tablet (200 mg total) by mouth 3 (three) times daily. Take for painful urination. 12/29/19   Lutricia Feil, PA-C    Family History Family History  Problem Relation Age of Onset   Lung cancer Father    Breast cancer Sister 65    Lung cancer Brother    Hodgkin's lymphoma Brother    Breast cancer Sister 77    Social History Social History   Tobacco Use   Smoking status: Current Every Day Smoker    Packs/day: 0.75    Years: 57.00    Pack years: 42.75    Types: Cigarettes   Smokeless tobacco: Never Used  Building services engineer Use: Never used  Substance Use Topics   Alcohol use: Not on file   Drug use: Not on file     Allergies   Patient has no known allergies.   Review of Systems Review of Systems  Constitutional: Positive for activity change. Negative for appetite change, chills, fatigue and fever.  Genitourinary: Positive for dysuria, frequency and urgency. Negative for vaginal discharge.  All other systems reviewed and are negative.    Physical Exam Triage Vital Signs ED Triage Vitals  Enc Vitals Group     BP 12/29/19 0844 135/68     Pulse Rate 12/29/19 0844 (!) 59     Resp 12/29/19 0844 14     Temp 12/29/19 0844 97.7 F (36.5 C)     Temp Source 12/29/19 0844 Oral     SpO2 12/29/19 0844 97 %     Weight 12/29/19 0843 175 lb (79.4 kg)  Height 12/29/19 0843 5\' 1"  (1.549 m)     Head Circumference --      Peak Flow --      Pain Score 12/29/19 0843 8     Pain Loc --      Pain Edu? --      Excl. in GC? --    No data found.  Updated Vital Signs BP 135/68 (BP Location: Left Arm)    Pulse (!) 59    Temp 97.7 F (36.5 C) (Oral)    Resp 14    Ht 5\' 1"  (1.549 m)    Wt 175 lb (79.4 kg)    SpO2 97%    BMI 33.07 kg/m   Visual Acuity Right Eye Distance:   Left Eye Distance:   Bilateral Distance:    Right Eye Near:   Left Eye Near:    Bilateral Near:     Physical Exam Vitals and nursing note reviewed.  Constitutional:      General: She is not in acute distress.    Appearance: Normal appearance. She is not ill-appearing or toxic-appearing.  HENT:     Head: Normocephalic and atraumatic.  Eyes:     Conjunctiva/sclera: Conjunctivae normal.  Cardiovascular:     Rate and  Rhythm: Normal rate and regular rhythm.     Heart sounds: Normal heart sounds.  Pulmonary:     Effort: Pulmonary effort is normal.     Breath sounds: Normal breath sounds.  Abdominal:     General: Abdomen is flat. There is no distension.     Palpations: Abdomen is soft.     Tenderness: There is abdominal tenderness. There is no right CVA tenderness, left CVA tenderness, guarding or rebound.     Comments: Patient has mild tenderness in the right lower quadrant and suprapubically.  Musculoskeletal:        General: Normal range of motion.     Cervical back: Normal range of motion and neck supple.  Skin:    General: Skin is warm and dry.  Neurological:     General: No focal deficit present.     Mental Status: She is alert and oriented to person, place, and time.  Psychiatric:        Mood and Affect: Mood normal.        Behavior: Behavior normal.        Thought Content: Thought content normal.        Judgment: Judgment normal.      UC Treatments / Results  Labs (all labs ordered are listed, but only abnormal results are displayed) Labs Reviewed  URINALYSIS, COMPLETE (UACMP) WITH MICROSCOPIC - Abnormal; Notable for the following components:      Result Value   APPearance HAZY (*)    Hgb urine dipstick TRACE (*)    Protein, ur 30 (*)    Leukocytes,Ua LARGE (*)    Bacteria, UA FEW (*)    All other components within normal limits  URINE CULTURE    EKG   Radiology No results found.  Procedures Procedures (including critical care time)  Medications Ordered in UC Medications - No data to display  Initial Impression / Assessment and Plan / UC Course  I have reviewed the triage vital signs and the nursing notes.  Pertinent labs & imaging results that were available during my care of the patient were reviewed by me and considered in my medical decision making (see chart for details).   69 year old female presents with burning on urination that  started on Thursday 2 days  prior to this visit.  Denies any fever chills nausea vomiting or vaginal discharge.  She has had mild low back pain.  Review of medical records reveals that she has had several instances of UTI most recently last month treated with Keflex which helped but has returned.  Urinalysis revealed trace of hemoglobin, large leukocytes, with greater than 50 WBCs on spin down with WBC clumping.  Few bacteria present.  Her urine will be cultured.  We will start her on Keflex for 7 days.  Also provide her with Pyridium for 2 days because of the burning.  I have discussed with her recurrent UTIs in her age group.  She has an appointment next month with her primary care physician and I have encouraged her to talk with them regarding possible urology consult if she has another UTI.  If she is not improving she should contact her primary care or she may return to our clinic.   Final Clinical Impressions(s) / UC Diagnoses   Final diagnoses:  Lower urinary tract infectious disease     Discharge Instructions     Drink plenty of fluids.  Discuss with your primary care physician at your upcoming appointment next month regarding recurrent UTIs.    ED Prescriptions    Medication Sig Dispense Auth. Provider   cephALEXin (KEFLEX) 500 MG capsule Take 1 capsule (500 mg total) by mouth 2 (two) times daily. 14 capsule Crecencio Mc P, PA-C   phenazopyridine (PYRIDIUM) 200 MG tablet Take 1 tablet (200 mg total) by mouth 3 (three) times daily. Take for painful urination. 6 tablet Lorin Picket, PA-C     PDMP not reviewed this encounter.   Lorin Picket, PA-C 12/29/19 [phone]

## 2020-01-01 LAB — URINE CULTURE: Culture: >=100000 — AB

## 2020-01-14 ENCOUNTER — Ambulatory Visit
Admission: EM | Admit: 2020-01-14 | Discharge: 2020-01-14 | Disposition: A | Discharge status: Home / Self Care | Payer: Medicare Other | Attending: Family Medicine | Admitting: Family Medicine

## 2020-01-14 ENCOUNTER — Other Ambulatory Visit: Payer: Self-pay

## 2020-01-14 ENCOUNTER — Encounter: Payer: Self-pay | Admitting: Emergency Medicine

## 2020-01-14 DIAGNOSIS — N3000 Acute cystitis without hematuria: Secondary | ICD-10-CM | POA: Insufficient documentation

## 2020-01-14 LAB — URINALYSIS, COMPLETE (UACMP) WITH MICROSCOPIC
Bilirubin Urine: NEGATIVE
Glucose, UA: NEGATIVE mg/dL
Ketones, ur: NEGATIVE mg/dL
Nitrite: NEGATIVE
Protein, ur: NEGATIVE mg/dL
Specific Gravity, Urine: 1.02 (ref 1.005–1.030)
pH: 6 (ref 5.0–8.0)

## 2020-01-14 MED ORDER — CEFDINIR 300 MG PO CAPS: 300.0000 mg | ORAL_CAPSULE | Freq: Two times a day (BID) | ORAL | 0 refills | Status: DC

## 2020-01-14 NOTE — ED Triage Notes (Signed)
Pt pt c/o dysuria that started about 5 days ago. She states she does not think she recovered from the last one that she was seen here for on 12/29/19.

## 2020-01-14 NOTE — ED Provider Notes (Signed)
[facility]    CSN: [id number] Arrival date & time: 01/14/20  1228  History   Chief Complaint Chief Complaint  Patient presents with  . Dysuria   HPI   69 year old female presents with dysuria.  Patient reports that her symptoms started on Wednesday.  She reports dysuria, urinary frequency, and urgency.  Patient has had 2 recent UTIs which grew out Proteus that was resistant to nitrofurantoin.  She has been treated with Keflex each time.  No flank pain.  No fever.  She has taken Azo with improvement but no resolution.  No other associated symptoms.  No other complaints.  Past Surgical History:  Procedure Laterality Date  . BREAST BIOPSY Left 12/01/2017   FIBROADENOMATOUS CHANGE WITH CALCIFICATIONS   OB History   No obstetric history on file.    Home Medications    Prior to Admission medications   Medication Sig Start Date End Date Taking? Authorizing Provider  aspirin 81 MG EC tablet Take by mouth.   Yes [provider]  buPROPion (WELLBUTRIN XL) 150 MG 24 hr tablet Take 150 mg by mouth daily. 11/30/19  Yes [provider]  phenazopyridine (PYRIDIUM) 200 MG tablet Take 1 tablet (200 mg total) by mouth 3 (three) times daily. Take for painful urination. 12/29/19  Yes Lorin Picket, PA-C  cefdinir (OMNICEF) 300 MG capsule Take 1 capsule (300 mg total) by mouth 2 (two) times daily. 01/14/20   Coral Spikes, DO  cephALEXin (KEFLEX) 500 MG capsule Take 1 capsule (500 mg total) by mouth 2 (two) times daily. 12/29/19   Lorin Picket, PA-C    Family History Family History  Problem Relation Age of Onset  . Lung cancer Father   . Breast cancer Sister 77  . Lung cancer Brother   . Hodgkin's lymphoma Brother   . Breast cancer Sister 72    Social History Social History   Tobacco Use  . Smoking status: Current Every Day Smoker    Packs/day: 0.75    Years: 57.00    Pack years: 42.75    Types: Cigarettes  . Smokeless tobacco: Never Used    Vaping Use  . Vaping Use: Never used  Substance Use Topics  . Alcohol use: Not on file  . Drug use: Not on file     Allergies   Patient has no known allergies.   Review of Systems Review of Systems  Constitutional: Negative for fever.  Genitourinary: Positive for dysuria, frequency and urgency.   Physical Exam Triage Vital Signs ED Triage Vitals  Enc Vitals Group     BP 01/14/20 1350 140/67     Pulse Rate 01/14/20 1350 (!) 55     Resp 01/14/20 1350 18     Temp 01/14/20 1350 97.9 F (36.6 C)     Temp Source 01/14/20 1350 Oral     SpO2 01/14/20 1350 99 %     Weight 01/14/20 1348 175 lb 0.7 oz (79.4 kg)     Height 01/14/20 1348 5\' 1"  (1.549 m)     Head Circumference --      Peak Flow --      Pain Score 01/14/20 1348 8     Pain Loc --      Pain Edu? --      Excl. in [place]? --    Updated Vital Signs BP 140/67 (BP Location: Right Arm)   Pulse (!) 55   Temp 97.9 F (36.6 C) (Oral)   Resp  18   Ht 5\' 1"  (1.549 m)   Wt 79.4 kg   SpO2 99%   BMI 33.07 kg/m   Visual Acuity Right Eye Distance:   Left Eye Distance:   Bilateral Distance:    Right Eye Near:   Left Eye Near:    Bilateral Near:     Physical Exam Constitutional:      General: She is not in acute distress.    Appearance: Normal appearance. She is not ill-appearing.  HENT:     Head: Normocephalic and atraumatic.  Eyes:     General:        Right eye: No discharge.        Left eye: No discharge.     Conjunctiva/sclera: Conjunctivae normal.  Cardiovascular:     Rate and Rhythm: Normal rate and regular rhythm.  Pulmonary:     Effort: Pulmonary effort is normal. No respiratory distress.  Abdominal:     General: There is no distension.     Palpations: Abdomen is soft.     Tenderness: There is no abdominal tenderness.  Neurological:     Mental Status: She is alert.  Psychiatric:        Mood and Affect: Mood normal.        Behavior: Behavior normal.     UC Treatments / Results  Labs (all labs  ordered are listed, but only abnormal results are displayed) Labs Reviewed  URINALYSIS, COMPLETE (UACMP) WITH MICROSCOPIC - Abnormal; Notable for the following components:      Result Value   APPearance HAZY (*)    Hgb urine dipstick TRACE (*)    Leukocytes,Ua SMALL (*)    Bacteria, UA RARE (*)    All other components within normal limits    EKG   Radiology No results found.  Procedures Procedures (including critical care time)  Medications Ordered in UC Medications - No data to display  Initial Impression / Assessment and Plan / UC Course  I have reviewed the triage vital signs and the nursing notes.  Pertinent labs & imaging results that were available during my care of the patient were reviewed by me and considered in my medical decision making (see chart for details).    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  This appears to be a chronic problem with an acute exacerbation/recurrence.  Placing on Omnicef.  Final Clinical Impressions(s) / UC Diagnoses   Final diagnoses:  Acute cystitis without hematuria     Discharge Instructions     Antibiotic as prescribed.  Take care  Dr. 65    ED Prescriptions    Medication Sig Dispense Auth. Provider   cefdinir (OMNICEF) 300 MG capsule Take 1 capsule (300 mg total) by mouth 2 (two) times daily. 20 capsule Adriana Simas, DO     PDMP not reviewed this encounter.   Tommie Sams, Tommie Sams 01/14/20 [phone]

## 2020-01-14 NOTE — Discharge Instructions (Addendum)
Antibiotic as prescribed.  Take care  Dr. Yaqub Arney  

## 2020-01-15 ENCOUNTER — Telehealth: Payer: Self-pay

## 2020-01-15 DIAGNOSIS — Z87891 Personal history of nicotine dependence: Secondary | ICD-10-CM

## 2020-01-15 DIAGNOSIS — Z122 Encounter for screening for malignant neoplasm of respiratory organs: Secondary | ICD-10-CM

## 2020-01-15 NOTE — Telephone Encounter (Signed)
Patient has been notified that the low dose lung cancer screening CT scan is due currently or will be in near future.  Confirmed that patient is within the appropriate age range and asymptomatic, (no signs or symptoms of lung cancer).  Patient denies illness that would prevent curative treatment for lung cancer if found.  Patient is agreeable for CT scan being scheduled.    Verified smoking history (current smoker,  with 56 year 0.5 ppd history).    CT scheduled for 01/30/20 @ 11:25

## 2020-01-16 NOTE — Addendum Note (Signed)
Addended by: Jonne Ply on: 01/16/2020 02:04 PM   Modules accepted: Orders

## 2020-01-16 NOTE — Telephone Encounter (Signed)
Smoking history: current, 44 pack year 

## 2020-01-17 DIAGNOSIS — Z0000 Encounter for general adult medical examination without abnormal findings: Secondary | ICD-10-CM | POA: Diagnosis not present

## 2020-01-17 DIAGNOSIS — M47816 Spondylosis without myelopathy or radiculopathy, lumbar region: Secondary | ICD-10-CM | POA: Diagnosis not present

## 2020-01-17 DIAGNOSIS — Z780 Asymptomatic menopausal state: Secondary | ICD-10-CM | POA: Diagnosis not present

## 2020-01-17 DIAGNOSIS — F17200 Nicotine dependence, unspecified, uncomplicated: Secondary | ICD-10-CM | POA: Diagnosis not present

## 2020-01-17 DIAGNOSIS — R7303 Prediabetes: Secondary | ICD-10-CM | POA: Diagnosis not present

## 2020-01-17 DIAGNOSIS — D229 Melanocytic nevi, unspecified: Secondary | ICD-10-CM | POA: Diagnosis not present

## 2020-01-17 DIAGNOSIS — M5440 Lumbago with sciatica, unspecified side: Secondary | ICD-10-CM | POA: Diagnosis not present

## 2020-01-17 DIAGNOSIS — R399 Unspecified symptoms and signs involving the genitourinary system: Secondary | ICD-10-CM | POA: Diagnosis not present

## 2020-01-17 DIAGNOSIS — Z23 Encounter for immunization: Secondary | ICD-10-CM | POA: Diagnosis not present

## 2020-01-17 DIAGNOSIS — Z1231 Encounter for screening mammogram for malignant neoplasm of breast: Secondary | ICD-10-CM | POA: Diagnosis not present

## 2020-01-17 DIAGNOSIS — N390 Urinary tract infection, site not specified: Secondary | ICD-10-CM | POA: Diagnosis not present

## 2020-01-30 ENCOUNTER — Other Ambulatory Visit: Payer: Self-pay

## 2020-01-30 ENCOUNTER — Ambulatory Visit
Admission: RE | Admit: 2020-01-30 | Discharge: 2020-01-30 | Disposition: A | Discharge status: Home / Self Care | Payer: Medicare Other | Source: Ambulatory Visit | Attending: Oncology | Admitting: Oncology

## 2020-01-30 DIAGNOSIS — Z87891 Personal history of nicotine dependence: Secondary | ICD-10-CM | POA: Diagnosis not present

## 2020-01-30 DIAGNOSIS — Z122 Encounter for screening for malignant neoplasm of respiratory organs: Secondary | ICD-10-CM | POA: Insufficient documentation

## 2020-01-30 DIAGNOSIS — F17210 Nicotine dependence, cigarettes, uncomplicated: Secondary | ICD-10-CM | POA: Diagnosis not present

## 2020-02-01 ENCOUNTER — Encounter: Payer: Self-pay | Admitting: *Deleted

## 2020-02-18 ENCOUNTER — Other Ambulatory Visit: Payer: Self-pay | Admitting: Family Medicine

## 2020-02-18 DIAGNOSIS — Z1231 Encounter for screening mammogram for malignant neoplasm of breast: Secondary | ICD-10-CM

## 2020-02-18 DIAGNOSIS — R3915 Urgency of urination: Secondary | ICD-10-CM | POA: Diagnosis not present

## 2020-02-18 DIAGNOSIS — N390 Urinary tract infection, site not specified: Secondary | ICD-10-CM | POA: Diagnosis not present

## 2020-02-18 DIAGNOSIS — N941 Unspecified dyspareunia: Secondary | ICD-10-CM | POA: Diagnosis not present

## 2020-02-18 DIAGNOSIS — K5901 Slow transit constipation: Secondary | ICD-10-CM | POA: Diagnosis not present

## 2020-03-10 ENCOUNTER — Other Ambulatory Visit: Payer: Self-pay

## 2020-03-10 ENCOUNTER — Ambulatory Visit
Admission: RE | Admit: 2020-03-10 | Discharge: 2020-03-10 | Disposition: A | Discharge status: Home / Self Care | Payer: Medicare Other | Source: Ambulatory Visit | Attending: Family Medicine | Admitting: Family Medicine

## 2020-03-10 DIAGNOSIS — Z1231 Encounter for screening mammogram for malignant neoplasm of breast: Secondary | ICD-10-CM | POA: Insufficient documentation

## 2020-05-27 DIAGNOSIS — Z23 Encounter for immunization: Secondary | ICD-10-CM | POA: Diagnosis not present

## 2020-07-03 ENCOUNTER — Other Ambulatory Visit: Payer: Self-pay

## 2020-07-03 ENCOUNTER — Encounter: Payer: Self-pay | Admitting: Emergency Medicine

## 2020-07-03 ENCOUNTER — Ambulatory Visit
Admission: EM | Admit: 2020-07-03 | Discharge: 2020-07-03 | Disposition: A | Discharge status: Home / Self Care | Payer: Medicare Other | Attending: Physician Assistant | Admitting: Physician Assistant

## 2020-07-03 DIAGNOSIS — N3000 Acute cystitis without hematuria: Secondary | ICD-10-CM | POA: Insufficient documentation

## 2020-07-03 LAB — URINALYSIS, COMPLETE (UACMP) WITH MICROSCOPIC
Bilirubin Urine: NEGATIVE
Glucose, UA: NEGATIVE mg/dL
Ketones, ur: NEGATIVE mg/dL
Nitrite: POSITIVE — AB
Protein, ur: NEGATIVE mg/dL
Specific Gravity, Urine: 1.025 (ref 1.005–1.030)
pH: 7 (ref 5.0–8.0)

## 2020-07-03 MED ORDER — SULFAMETHOXAZOLE-TRIMETHOPRIM 800-160 MG PO TABS: 1.0000 | ORAL_TABLET | Freq: Two times a day (BID) | ORAL | 0 refills | Status: AC

## 2020-07-03 NOTE — ED Triage Notes (Signed)
Pt c/o dysuria, bladder pressure, urinary retention. Started about 2 days ago.

## 2020-07-03 NOTE — ED Provider Notes (Signed)
[facility]    CSN: [id number] Arrival date & time: 07/03/20  2297      History   Chief Complaint Chief Complaint  Patient presents with  . Dysuria    HPI Candace Andrews is a 69 y.o. female who presents due to having onset of dysuria, bladder pressure and feeling she is not emptying her bladder all the way x 2 days. Her last UTI was June 2021. Has seen a urologist for them, and was placed on estrogen cream. She denies any abnormal vaginal discharge or spotting. Has been taking Azo. She has not been sexually active x 3 years   History reviewed. No pertinent past medical history.  There are no problems to display for this patient.   Past Surgical History:  Procedure Laterality Date  . BREAST BIOPSY Left 12/01/2017   FIBROADENOMATOUS CHANGE WITH CALCIFICATIONS    OB History   No obstetric history on file.      Home Medications    Prior to Admission medications   Medication Sig Start Date End Date Taking? Authorizing Provider  aspirin 81 MG EC tablet Take by mouth.   Yes [provider]  estradiol (ESTRACE) 0.1 MG/GM vaginal cream Place vaginally. 01/17/20 01/16/21 Yes [provider]  buPROPion (WELLBUTRIN XL) 150 MG 24 hr tablet Take 150 mg by mouth daily. 11/30/19   [provider]  cefdinir (OMNICEF) 300 MG capsule Take 1 capsule (300 mg total) by mouth 2 (two) times daily. 01/14/20   Tommie Sams, DO  cephALEXin (KEFLEX) 500 MG capsule Take 1 capsule (500 mg total) by mouth 2 (two) times daily. 12/29/19   Lutricia Feil, PA-C  phenazopyridine (PYRIDIUM) 200 MG tablet Take 1 tablet (200 mg total) by mouth 3 (three) times daily. Take for painful urination. 12/29/19   Lutricia Feil, PA-C    Family History Family History  Problem Relation Age of Onset  . Lung cancer Father   . Breast cancer Sister 33  . Lung cancer Brother   . Hodgkin's lymphoma Brother   . Breast cancer Sister 81    Social History Social History    Tobacco Use  . Smoking status: Current Every Day Smoker    Packs/day: 0.75    Years: 57.00    Pack years: 42.75    Types: Cigarettes  . Smokeless tobacco: Never Used  Vaping Use  . Vaping Use: Never used     Allergies   Patient has no known allergies.   Review of Systems Review of Systems  Constitutional: Negative for appetite change, chills, diaphoresis and fever.  Gastrointestinal: Positive for abdominal pain.  Genitourinary: Positive for difficulty urinating, dysuria and frequency. Negative for flank pain, hematuria, pelvic pain, vaginal bleeding and vaginal discharge.  Musculoskeletal: Negative for back pain, gait problem and myalgias.  Skin: Negative for rash.     Physical Exam Triage Vital Signs ED Triage Vitals  Enc Vitals Group     BP 07/03/20 0825 (!) 172/71     Pulse Rate 07/03/20 0825 61     Resp 07/03/20 0825 18     Temp 07/03/20 0825 98.2 F (36.8 C)     Temp Source 07/03/20 0825 Oral     SpO2 07/03/20 0825 99 %     Weight 07/03/20 0823 175 lb 0.7 oz (79.4 kg)     Height 07/03/20 0823 5\' 1"  (1.549 m)     Head Circumference --      Peak Flow --  Pain Score 07/03/20 0823 7     Pain Loc --      Pain Edu? --      Excl. in GC? --    No data found.  Updated Vital Signs BP (!) 172/71 (BP Location: Right Arm)   Pulse 61   Temp 98.2 F (36.8 C) (Oral)   Resp 18   Ht 5\' 1"  (1.549 m)   Wt 175 lb 0.7 oz (79.4 kg)   SpO2 99%   BMI 33.07 kg/m   Visual Acuity Right Eye Distance:   Left Eye Distance:   Bilateral Distance:    Right Eye Near:   Left Eye Near:    Bilateral Near:     Physical Exam Physical Exam Vitals and nursing note reviewed.  Constitutional:      General: She is not in acute distress.    Appearance: She is not toxic-appearing.  HENT:     Head: Normocephalic.     Right Ear: External ear normal.     Left Ear: External ear normal.  Eyes:     General: No scleral icterus.    Conjunctiva/sclera: Conjunctivae normal.   Pulmonary:     Effort: Pulmonary effort is normal.  Abdominal:     General: Bowel sounds are normal.     Palpations: Abdomen is soft. There is no mass.     Tenderness: There is no guarding or rebound.     Comments: - CVA tenderness   Musculoskeletal:        General: Normal range of motion.     Cervical back: Neck supple.    Skin:    General: Skin is warm and dry.     Findings: No rash.  Neurological:     Mental Status: She is alert and oriented to person, place, and time.     Gait: Gait normal.  Psychiatric:        Mood and Affect: Mood normal.        Behavior: Behavior normal.        Thought Content: Thought content normal.        Judgment: Judgment normal.     UC Treatments / Results  Labs (all labs ordered are listed, but only abnormal results are displayed) Labs Reviewed  URINALYSIS, COMPLETE (UACMP) WITH MICROSCOPIC    EKG   Radiology No results found.  Procedures Procedures (including critical care time)  Medications Ordered in UC Medications - No data to display  Initial Impression / Assessment and Plan / UC Course  I have reviewed the triage vital signs and the nursing notes. Has recurrent UTI. I placed her on Bactrim DS since her past cultures did not show resistance to this. Urine culture was sent out.  Pertinent labs results that were available during my care of the patient were reviewed by me and considered in my medical decision making (see chart for details).  Final Clinical Impressions(s) / UC Diagnoses   Final diagnoses:  None   Discharge Instructions   None    ED Prescriptions    None     PDMP not reviewed this encounter.   , PA-C 07/03/20 1006

## 2020-07-05 LAB — URINE CULTURE
Culture: >=100000 — AB
Special Requests: NORMAL

## 2020-08-12 DIAGNOSIS — N390 Urinary tract infection, site not specified: Secondary | ICD-10-CM | POA: Diagnosis not present

## 2020-09-23 DIAGNOSIS — H524 Presbyopia: Secondary | ICD-10-CM | POA: Diagnosis not present

## 2020-11-04 DIAGNOSIS — H18413 Arcus senilis, bilateral: Secondary | ICD-10-CM | POA: Diagnosis not present

## 2020-11-04 DIAGNOSIS — H25013 Cortical age-related cataract, bilateral: Secondary | ICD-10-CM | POA: Diagnosis not present

## 2020-11-04 DIAGNOSIS — H25043 Posterior subcapsular polar age-related cataract, bilateral: Secondary | ICD-10-CM | POA: Diagnosis not present

## 2020-11-04 DIAGNOSIS — H2513 Age-related nuclear cataract, bilateral: Secondary | ICD-10-CM | POA: Diagnosis not present

## 2020-11-10 DIAGNOSIS — N390 Urinary tract infection, site not specified: Secondary | ICD-10-CM | POA: Diagnosis not present

## 2020-12-29 DIAGNOSIS — H2512 Age-related nuclear cataract, left eye: Secondary | ICD-10-CM | POA: Diagnosis not present

## 2020-12-30 DIAGNOSIS — H2511 Age-related nuclear cataract, right eye: Secondary | ICD-10-CM | POA: Diagnosis not present

## 2021-01-26 DIAGNOSIS — H2511 Age-related nuclear cataract, right eye: Secondary | ICD-10-CM | POA: Diagnosis not present

## 2021-03-12 DIAGNOSIS — F5104 Psychophysiologic insomnia: Secondary | ICD-10-CM | POA: Diagnosis not present

## 2021-03-12 DIAGNOSIS — Z780 Asymptomatic menopausal state: Secondary | ICD-10-CM | POA: Diagnosis not present

## 2021-03-12 DIAGNOSIS — Z122 Encounter for screening for malignant neoplasm of respiratory organs: Secondary | ICD-10-CM | POA: Diagnosis not present

## 2021-03-12 DIAGNOSIS — R399 Unspecified symptoms and signs involving the genitourinary system: Secondary | ICD-10-CM | POA: Diagnosis not present

## 2021-03-12 DIAGNOSIS — Z0000 Encounter for general adult medical examination without abnormal findings: Secondary | ICD-10-CM | POA: Diagnosis not present

## 2021-03-12 DIAGNOSIS — E7800 Pure hypercholesterolemia, unspecified: Secondary | ICD-10-CM | POA: Diagnosis not present

## 2021-03-12 DIAGNOSIS — J431 Panlobular emphysema: Secondary | ICD-10-CM | POA: Diagnosis not present

## 2021-03-12 DIAGNOSIS — Z23 Encounter for immunization: Secondary | ICD-10-CM | POA: Diagnosis not present

## 2021-03-12 DIAGNOSIS — N390 Urinary tract infection, site not specified: Secondary | ICD-10-CM | POA: Diagnosis not present

## 2021-03-12 DIAGNOSIS — Z1231 Encounter for screening mammogram for malignant neoplasm of breast: Secondary | ICD-10-CM | POA: Diagnosis not present

## 2021-03-12 DIAGNOSIS — Z720 Tobacco use: Secondary | ICD-10-CM | POA: Diagnosis not present

## 2021-03-12 DIAGNOSIS — R7303 Prediabetes: Secondary | ICD-10-CM | POA: Diagnosis not present

## 2021-03-13 ENCOUNTER — Other Ambulatory Visit: Payer: Self-pay | Admitting: Family Medicine

## 2021-03-13 DIAGNOSIS — Z1231 Encounter for screening mammogram for malignant neoplasm of breast: Secondary | ICD-10-CM

## 2021-03-13 DIAGNOSIS — Z780 Asymptomatic menopausal state: Secondary | ICD-10-CM

## 2021-03-17 DIAGNOSIS — J431 Panlobular emphysema: Secondary | ICD-10-CM | POA: Diagnosis not present

## 2021-03-17 DIAGNOSIS — Z87891 Personal history of nicotine dependence: Secondary | ICD-10-CM | POA: Diagnosis not present

## 2021-04-21 DIAGNOSIS — J431 Panlobular emphysema: Secondary | ICD-10-CM | POA: Diagnosis not present

## 2021-04-21 DIAGNOSIS — Z87891 Personal history of nicotine dependence: Secondary | ICD-10-CM | POA: Diagnosis not present

## 2021-05-12 DIAGNOSIS — D224 Melanocytic nevi of scalp and neck: Secondary | ICD-10-CM | POA: Diagnosis not present

## 2021-05-12 DIAGNOSIS — D225 Melanocytic nevi of trunk: Secondary | ICD-10-CM | POA: Diagnosis not present

## 2021-05-12 DIAGNOSIS — D2230 Melanocytic nevi of unspecified part of face: Secondary | ICD-10-CM | POA: Diagnosis not present

## 2021-05-12 DIAGNOSIS — L988 Other specified disorders of the skin and subcutaneous tissue: Secondary | ICD-10-CM | POA: Diagnosis not present

## 2021-05-13 ENCOUNTER — Ambulatory Visit
Admission: RE | Admit: 2021-05-13 | Discharge: 2021-05-13 | Disposition: A | Discharge status: Home / Self Care | Payer: Medicare Other | Source: Ambulatory Visit | Attending: Family Medicine | Admitting: Family Medicine

## 2021-05-13 ENCOUNTER — Other Ambulatory Visit: Payer: Self-pay

## 2021-05-13 DIAGNOSIS — M069 Rheumatoid arthritis, unspecified: Secondary | ICD-10-CM | POA: Diagnosis not present

## 2021-05-13 DIAGNOSIS — Z780 Asymptomatic menopausal state: Secondary | ICD-10-CM | POA: Insufficient documentation

## 2021-05-13 DIAGNOSIS — J449 Chronic obstructive pulmonary disease, unspecified: Secondary | ICD-10-CM | POA: Diagnosis not present

## 2021-05-13 DIAGNOSIS — M8589 Other specified disorders of bone density and structure, multiple sites: Secondary | ICD-10-CM | POA: Diagnosis not present

## 2021-05-13 DIAGNOSIS — Z1231 Encounter for screening mammogram for malignant neoplasm of breast: Secondary | ICD-10-CM | POA: Diagnosis not present

## 2021-05-13 DIAGNOSIS — Z13820 Encounter for screening for osteoporosis: Secondary | ICD-10-CM | POA: Insufficient documentation

## 2021-05-13 DIAGNOSIS — F17200 Nicotine dependence, unspecified, uncomplicated: Secondary | ICD-10-CM | POA: Diagnosis not present

## 2021-05-18 DIAGNOSIS — L814 Other melanin hyperpigmentation: Secondary | ICD-10-CM | POA: Diagnosis not present

## 2021-05-19 DIAGNOSIS — Z87891 Personal history of nicotine dependence: Secondary | ICD-10-CM | POA: Diagnosis not present

## 2021-05-19 DIAGNOSIS — J431 Panlobular emphysema: Secondary | ICD-10-CM | POA: Diagnosis not present

## 2021-05-19 DIAGNOSIS — L988 Other specified disorders of the skin and subcutaneous tissue: Secondary | ICD-10-CM | POA: Diagnosis not present

## 2021-06-01 ENCOUNTER — Other Ambulatory Visit: Payer: Self-pay | Admitting: *Deleted

## 2021-06-01 DIAGNOSIS — Z87891 Personal history of nicotine dependence: Secondary | ICD-10-CM

## 2021-06-01 DIAGNOSIS — F17210 Nicotine dependence, cigarettes, uncomplicated: Secondary | ICD-10-CM

## 2021-06-13 IMAGING — MG DIGITAL SCREENING BILAT W/ TOMO W/ CAD
8 series · 8 of 24 positions shown · non-contrast
Comparison: Previous exam(s).

CLINICAL DATA: Screening.

EXAM:
DIGITAL SCREENING BILATERAL MAMMOGRAM WITH TOMO AND CAD

[L MLO synth-2D]
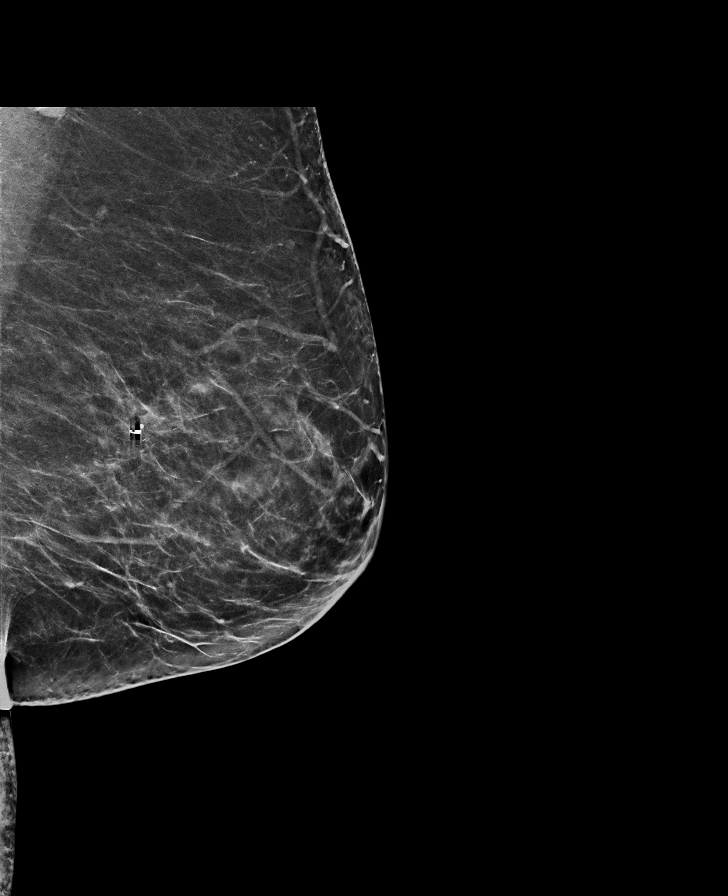

[R CC synth-2D]
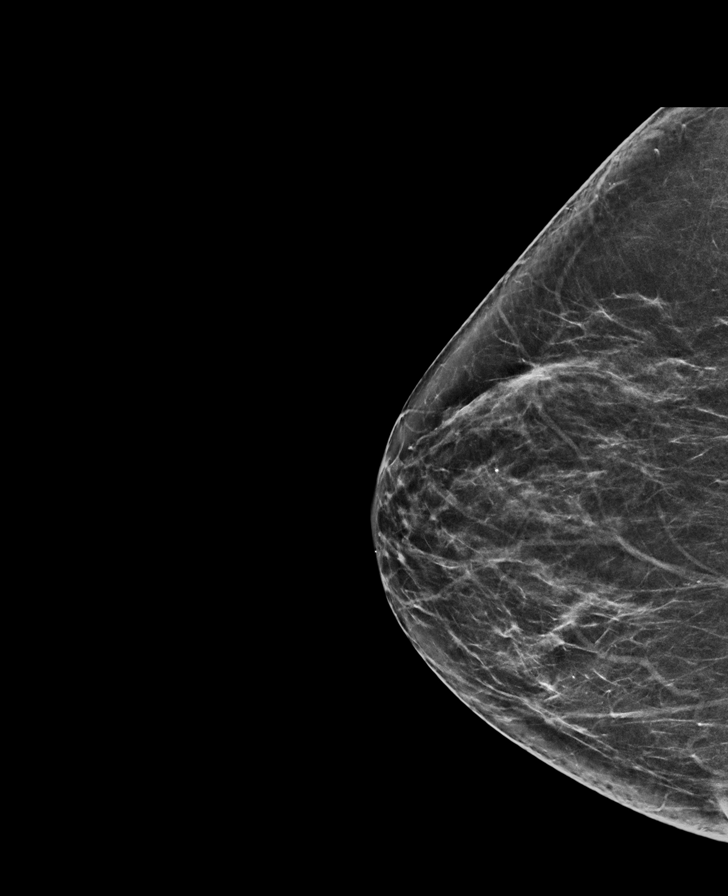

[R MLO synth-2D]
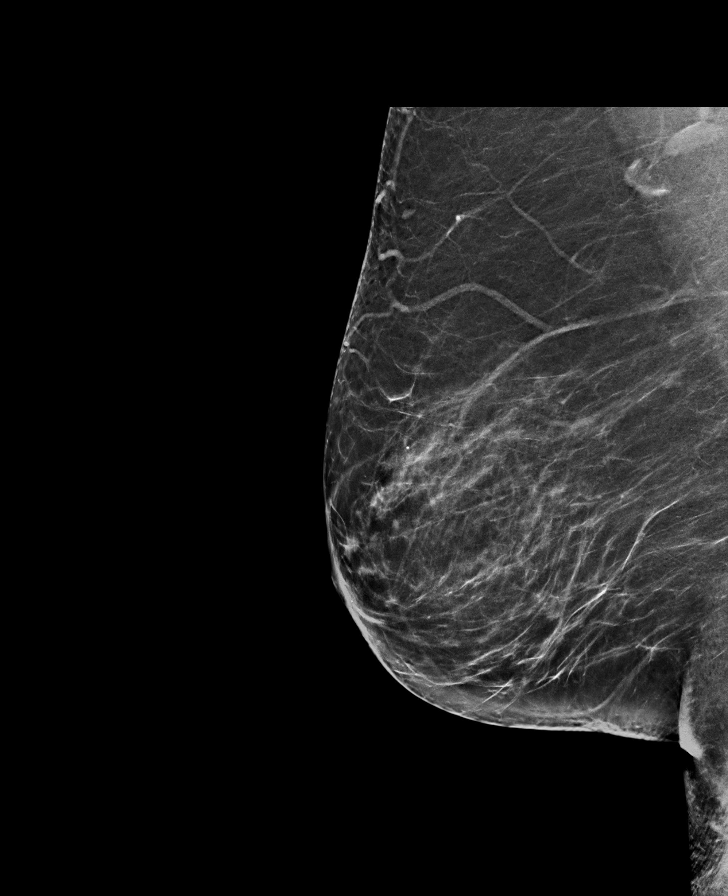

[L CC synth-2D]
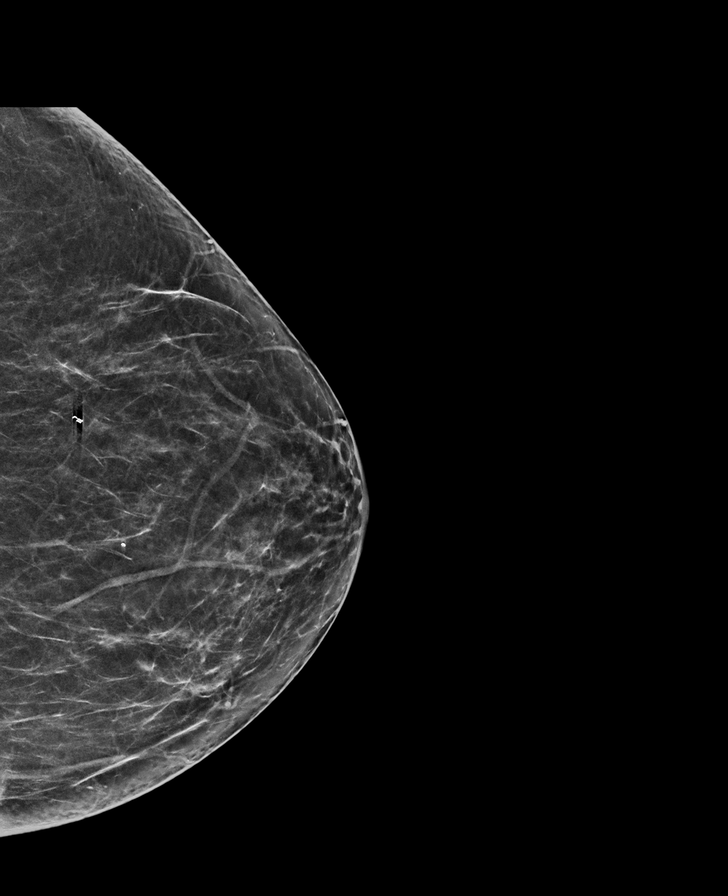

[R MLO tomo · tomo slice 39/78.0]
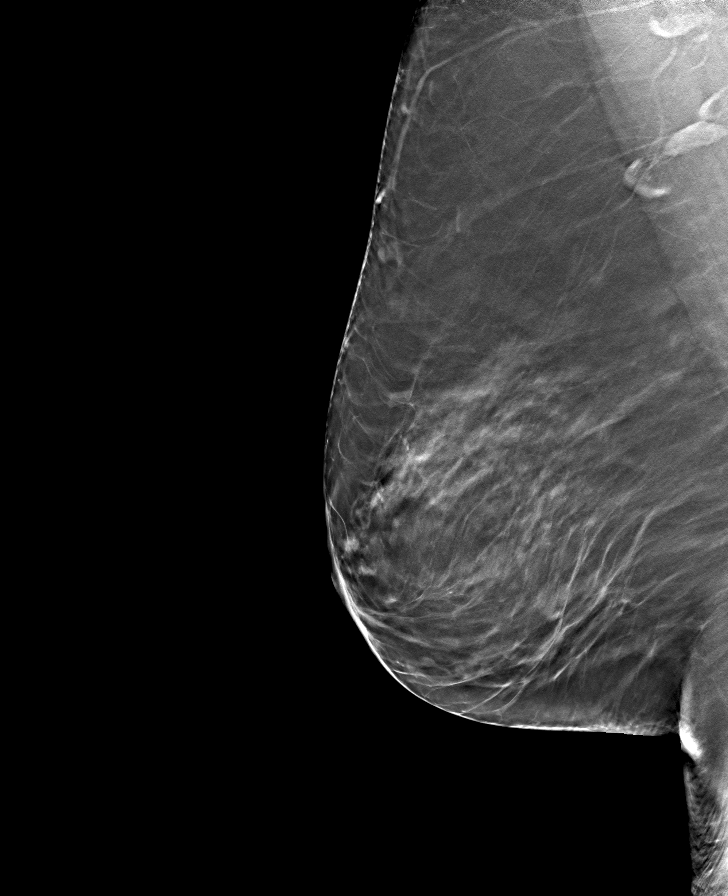

[L CC tomo · tomo slice 33/66.0]
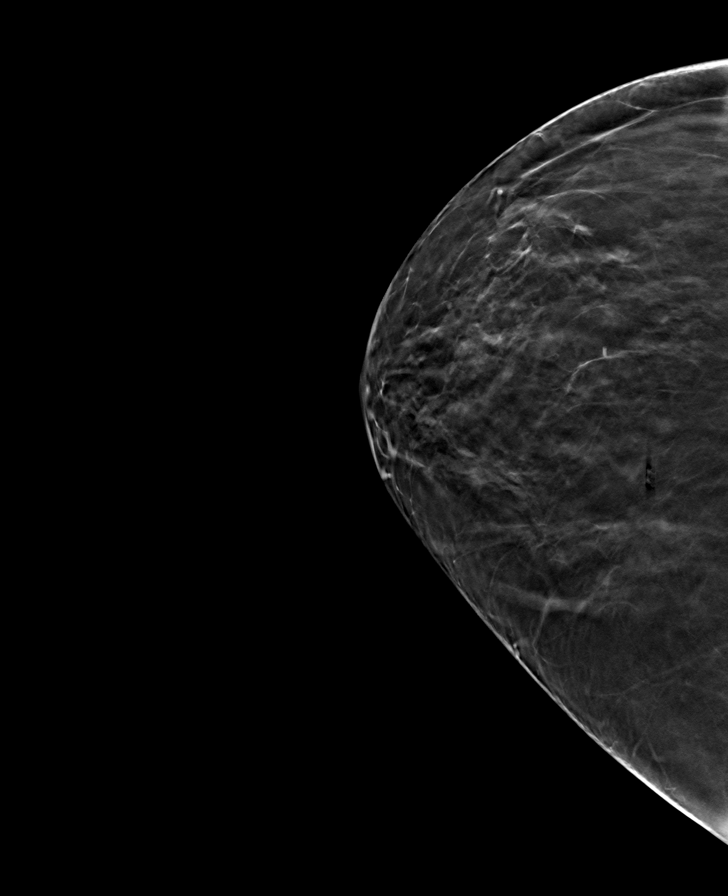

[L MLO tomo · tomo slice 37/72.0]
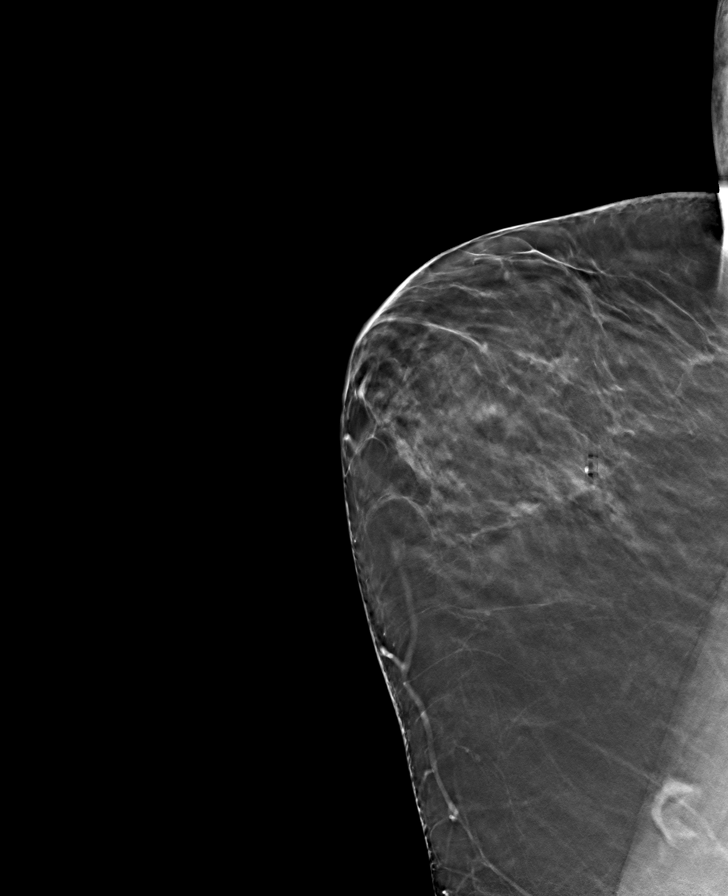

[R CC tomo · tomo slice 34/67.0]
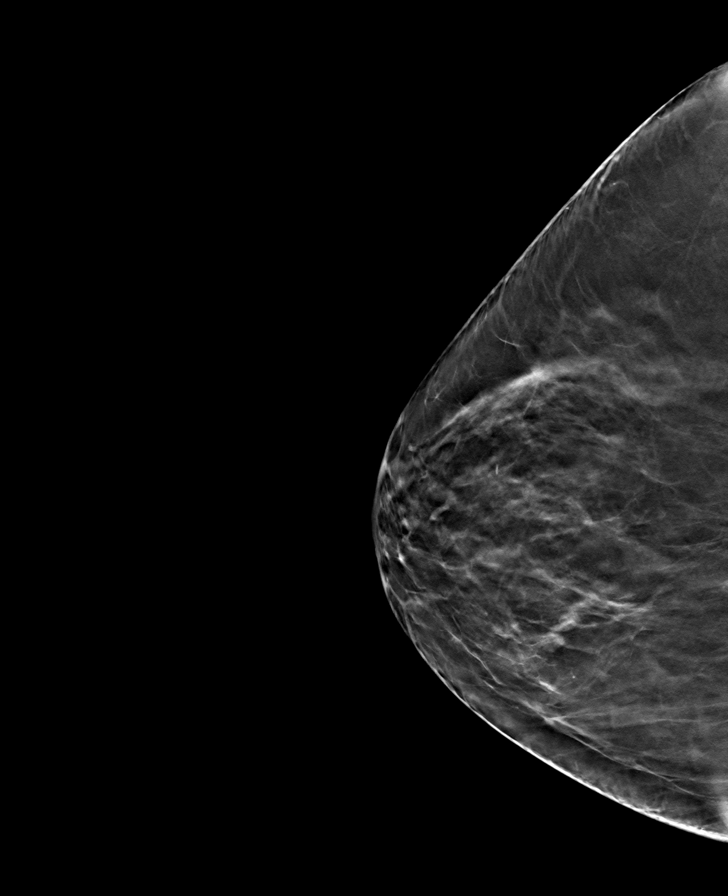

[8 of 24 positions shown; findings below may reference images not displayed]

ACR Breast Density Category b: There are scattered areas of
fibroglandular density.
FINDINGS: There are no findings suspicious for malignancy. Images were
processed with CAD.
IMPRESSION: No mammographic evidence of malignancy. A result letter of this
screening mammogram will be mailed directly to the patient.

RECOMMENDATION:
Screening mammogram in one year. (Code:[id number])

BI-RADS CATEGORY  1: Negative.

## 2021-06-16 ENCOUNTER — Ambulatory Visit
Admission: RE | Admit: 2021-06-16 | Discharge: 2021-06-16 | Disposition: A | Discharge status: Home / Self Care | Payer: Medicare Other | Source: Ambulatory Visit | Attending: Acute Care | Admitting: Acute Care

## 2021-06-16 ENCOUNTER — Other Ambulatory Visit: Payer: Self-pay

## 2021-06-16 DIAGNOSIS — Z87891 Personal history of nicotine dependence: Secondary | ICD-10-CM | POA: Insufficient documentation

## 2021-06-16 DIAGNOSIS — F17210 Nicotine dependence, cigarettes, uncomplicated: Secondary | ICD-10-CM | POA: Insufficient documentation

## 2021-06-18 ENCOUNTER — Telehealth: Payer: Self-pay | Admitting: Acute Care

## 2021-06-18 DIAGNOSIS — G4709 Other insomnia: Secondary | ICD-10-CM | POA: Diagnosis not present

## 2021-06-18 DIAGNOSIS — Z87891 Personal history of nicotine dependence: Secondary | ICD-10-CM | POA: Diagnosis not present

## 2021-06-18 DIAGNOSIS — Z23 Encounter for immunization: Secondary | ICD-10-CM | POA: Diagnosis not present

## 2021-06-18 DIAGNOSIS — M810 Age-related osteoporosis without current pathological fracture: Secondary | ICD-10-CM | POA: Diagnosis not present

## 2021-06-18 NOTE — Telephone Encounter (Signed)
Received call from [facility] with [facility] for pt's LDCT, impression below:   IMPRESSION: 1. Lung-RADS 2, benign appearance or behavior. Continue annual screening with low-dose chest CT without contrast in 12 months. 2. Two-vessel coronary atherosclerosis. 3. Mild T4 vertebral compression fracture, of uncertain chronicity, possibly acute. 4. Diffuse osteopenia. 5. Aortic Atherosclerosis (ICD10-I70.0) and Emphysema (ICD10-J43.9).  Will forward to Kandice Robinsons, NP, as Lorain Childes.

## 2021-06-19 ENCOUNTER — Other Ambulatory Visit: Payer: Self-pay

## 2021-06-19 ENCOUNTER — Telehealth: Payer: Self-pay | Admitting: Acute Care

## 2021-06-19 DIAGNOSIS — F17210 Nicotine dependence, cigarettes, uncomplicated: Secondary | ICD-10-CM

## 2021-06-19 DIAGNOSIS — Z87891 Personal history of nicotine dependence: Secondary | ICD-10-CM

## 2021-06-19 NOTE — Telephone Encounter (Signed)
Noted.   Will remove from triage pool as cancer screening program will contact patient.

## 2021-06-19 NOTE — Progress Notes (Signed)
I have called the patient with the results of her low dose CT chest. Her scan was read as a Lung RADS 2: nodules that are benign in appearance and behavior with a very low likelihood of becoming a clinically active cancer due to size or lack of growth. Recommendation per radiology is for a repeat LDCT in 12 months.  There was an incidental finding of a T4 compression fracture , unsure if acute or chronic. She states she has been having back pain. Additionally she has CAD, she states she is on statin therapy. I asked her to follow up with her PCP for further evaluation and treatment. She verbalized understanding.  Denise, 12 month follow up, and fax results to PCP. Thanks so much

## 2021-06-19 NOTE — Telephone Encounter (Signed)
LDCT results routed to PCP and 2023 LDCT order placed

## 2021-06-19 NOTE — Telephone Encounter (Signed)
I have called the patient with the results of her low dose CT chest. Her scan was read as a Lung RADS 2: nodules that are benign in appearance and behavior with a very low likelihood of becoming a clinically active cancer due to size or lack of growth. Recommendation per radiology is for a repeat LDCT in 12 months.  There was an incidental finding of a T4 compression fracture , unsure if acute or chronic. She states she has been having back pain. Additionally she has CAD, she states she is on statin therapy. I asked her to follow up with her PCP for further evaluation and treatment. She verbalized understanding.  Denise, 12 month follow up, and fax results to PCP. Thanks so much

## 2022-06-16 ENCOUNTER — Ambulatory Visit
Admission: RE | Admit: 2022-06-16 | Discharge: 2022-06-16 | Disposition: A | Discharge status: Home / Self Care | Payer: Medicare Other | Source: Ambulatory Visit | Attending: Family Medicine | Admitting: Family Medicine

## 2022-06-16 DIAGNOSIS — Z87891 Personal history of nicotine dependence: Secondary | ICD-10-CM | POA: Insufficient documentation

## 2022-06-16 DIAGNOSIS — F17210 Nicotine dependence, cigarettes, uncomplicated: Secondary | ICD-10-CM | POA: Insufficient documentation

## 2022-06-18 ENCOUNTER — Other Ambulatory Visit: Payer: Self-pay | Admitting: Acute Care

## 2022-06-18 DIAGNOSIS — F17210 Nicotine dependence, cigarettes, uncomplicated: Secondary | ICD-10-CM

## 2022-06-18 DIAGNOSIS — Z122 Encounter for screening for malignant neoplasm of respiratory organs: Secondary | ICD-10-CM

## 2022-06-18 DIAGNOSIS — Z87891 Personal history of nicotine dependence: Secondary | ICD-10-CM

## 2022-07-25 ENCOUNTER — Ambulatory Visit
Admission: EM | Admit: 2022-07-25 | Discharge: 2022-07-25 | Disposition: A | Discharge status: Home / Self Care | Payer: 59 | Attending: Physician Assistant | Admitting: Physician Assistant

## 2022-07-25 ENCOUNTER — Encounter: Payer: Self-pay | Admitting: Emergency Medicine

## 2022-07-25 DIAGNOSIS — N3000 Acute cystitis without hematuria: Secondary | ICD-10-CM | POA: Insufficient documentation

## 2022-07-25 DIAGNOSIS — R300 Dysuria: Secondary | ICD-10-CM | POA: Diagnosis present

## 2022-07-25 LAB — URINALYSIS, ROUTINE W REFLEX MICROSCOPIC
Bilirubin Urine: NEGATIVE
Glucose, UA: NEGATIVE mg/dL
Ketones, ur: NEGATIVE mg/dL
Nitrite: NEGATIVE
Specific Gravity, Urine: 1.02 (ref 1.005–1.030)
pH: 5.5 (ref 5.0–8.0)

## 2022-07-25 LAB — URINALYSIS, MICROSCOPIC (REFLEX)

## 2022-07-25 MED ORDER — CEPHALEXIN 500 MG PO CAPS: 500.0000 mg | ORAL_CAPSULE | Freq: Two times a day (BID) | ORAL | 0 refills | Status: AC

## 2022-07-25 NOTE — ED Provider Notes (Signed)
[facility]    CSN: [id number] Arrival date & time: 07/25/22  1504      History   Chief Complaint Chief Complaint  Patient presents with   Dysuria    HPI Candace Andrews is a 72 y.o. female presenting for 1 week history of dysuria, frequency and urgency.  Reports some lower abdominal cramping but says it has improved.  She denies fever.  Has had some chills at times.  No flank pain, vomiting or diarrhea.  Reports history of UTIs and believes she has UTI.  Last UTI was 2 years ago.  No other complaints.   HPI  History reviewed. No pertinent past medical history.  There are no problems to display for this patient.   Past Surgical History:  Procedure Laterality Date   BREAST BIOPSY Left 12/01/2017   FIBROADENOMATOUS CHANGE WITH CALCIFICATIONS    OB History   No obstetric history on file.      Home Medications    Prior to Admission medications   Medication Sig Start Date End Date Taking? Authorizing Provider  cephALEXin (KEFLEX) 500 MG capsule Take 1 capsule (500 mg total) by mouth 2 (two) times daily for 7 days. 07/25/22 08/01/22 Yes Shirlee Latch, PA-C  estradiol (ESTRACE) 0.1 MG/GM vaginal cream Place vaginally. 03/12/21  Yes [provider]  gabapentin (NEURONTIN) 400 MG capsule Take 400 mg by mouth at bedtime. 07/06/22  Yes [provider]  ibandronate (BONIVA) 150 MG tablet Take by mouth. 06/02/22 06/02/23 Yes [provider]  meloxicam (MOBIC) 15 MG tablet Take 15 mg by mouth daily. 03/24/22  Yes [provider]  aspirin 81 MG EC tablet Take by mouth.    [provider]  Cholecalciferol 50 MCG (2000 UT) CAPS Take by mouth.    [provider]  FLUoxetine (PROZAC) 40 MG capsule Take 40 mg by mouth daily.    [provider]  pravastatin (PRAVACHOL) 20 MG tablet Take 20 mg by mouth at bedtime.    [provider]  buPROPion (WELLBUTRIN XL) 150 MG 24 hr tablet Take 150 mg by mouth daily.  11/30/19 07/03/20  [provider]    Family History Family History  Problem Relation Age of Onset   Lung cancer Father    Breast cancer Sister 68   Lung cancer Brother    Hodgkin's lymphoma Brother    Breast cancer Sister 41    Social History Social History   Tobacco Use   Smoking status: Every Day    Packs/day: 0.75    Years: 57.00    Total pack years: 42.75    Types: Cigarettes   Smokeless tobacco: Never  Vaping Use   Vaping Use: Never used     Allergies   Patient has no known allergies.   Review of Systems Review of Systems  Constitutional:  Positive for chills. Negative for fatigue and fever.  Gastrointestinal:  Positive for abdominal pain. Negative for diarrhea, nausea and vomiting.  Genitourinary:  Positive for dysuria, frequency and urgency. Negative for decreased urine volume, flank pain, hematuria, pelvic pain, vaginal bleeding, vaginal discharge and vaginal pain.  Musculoskeletal:  Negative for back pain.  Skin:  Negative for rash.     Physical Exam Triage Vital Signs ED Triage Vitals  Enc Vitals Group     BP 07/25/22 1544 133/66     Pulse Rate 07/25/22 1544 (!) 56     Resp 07/25/22 1544 14     Temp 07/25/22 1544 98.9 F (  37.2 C)     Temp Source 07/25/22 1544 Oral     SpO2 07/25/22 1544 97 %     Weight 07/25/22 1542 180 lb (81.6 kg)     Height 07/25/22 1542 5\' 4"  (1.626 m)     Head Circumference --      Peak Flow --      Pain Score 07/25/22 1542 9     Pain Loc --      Pain Edu? --      Excl. in GC? --    No data found.  Updated Vital Signs BP 133/66 (BP Location: Left Arm)   Pulse (!) 56   Temp 98.9 F (37.2 C) (Oral)   Resp 14   Ht 5\' 4"  (1.626 m)   Wt 180 lb (81.6 kg)   SpO2 97%   BMI 30.90 kg/m    Physical Exam Vitals and nursing note reviewed.  Constitutional:      General: She is not in acute distress.    Appearance: Normal appearance. She is not ill-appearing or toxic-appearing.  HENT:     Head: Normocephalic  and atraumatic.  Eyes:     General: No scleral icterus.       Right eye: No discharge.        Left eye: No discharge.     Conjunctiva/sclera: Conjunctivae normal.  Cardiovascular:     Rate and Rhythm: Regular rhythm. Bradycardia present.     Heart sounds: Normal heart sounds.  Pulmonary:     Effort: Pulmonary effort is normal. No respiratory distress.     Breath sounds: Normal breath sounds.  Abdominal:     Palpations: Abdomen is soft.     Tenderness: There is abdominal tenderness (suprapubic, RLQ). There is no right CVA tenderness or left CVA tenderness.  Musculoskeletal:     Cervical back: Neck supple.  Skin:    General: Skin is dry.  Neurological:     General: No focal deficit present.     Mental Status: She is alert. Mental status is at baseline.     Motor: No weakness.     Gait: Gait normal.  Psychiatric:        Mood and Affect: Mood normal.        Behavior: Behavior normal.        Thought Content: Thought content normal.      UC Treatments / Results  Labs (all labs ordered are listed, but only abnormal results are displayed) Labs Reviewed  URINALYSIS, ROUTINE W REFLEX MICROSCOPIC - Abnormal; Notable for the following components:      Result Value   APPearance HAZY (*)    Hgb urine dipstick TRACE (*)    Protein, ur TRACE (*)    Leukocytes,Ua LARGE (*)    All other components within normal limits  URINALYSIS, MICROSCOPIC (REFLEX) - Abnormal; Notable for the following components:   Bacteria, UA MANY (*)    All other components within normal limits  URINE CULTURE    EKG   Radiology No results found.  Procedures Procedures (including critical care time)  Medications Ordered in UC Medications - No data to display  Initial Impression / Assessment and Plan / UC Course  I have reviewed the triage vital signs and the nursing notes.  Pertinent labs & imaging results that were available during my care of the patient were reviewed by me and considered in my  medical decision making (see chart for details).   72 year old female presents for 1 week history  of dysuria, frequency and urgency.  Urinalysis performed today shows trace hemoglobin, protein, large leukocytes, many bacteria on microscopic analysis.  Will send urine for culture and treat with Keflex.  Advised rest and fluids and continue with Pyridium.  Reviewed return and ER precautions.   Final Clinical Impressions(s) / UC Diagnoses   Final diagnoses:  Acute cystitis without hematuria  Dysuria     Discharge Instructions      UTI: Based on either symptoms or urinalysis, you may have a urinary tract infection. We will send the urine for culture and call with results in a few days. Begin antibiotics at this time. Your symptoms should be much improved over the next 2-3 days. Increase rest and fluid intake. If for some reason symptoms are worsening or not improving after a couple of days or the urine culture determines the antibiotics you are taking will not treat the infection, the antibiotics may be changed. Return or go to ER for fever, back pain, worsening urinary pain, discharge, increased blood in urine. May take Tylenol or Motrin OTC for pain relief or consider AZO if no contraindications      ED Prescriptions     Medication Sig Dispense Auth. Provider   cephALEXin (KEFLEX) 500 MG capsule Take 1 capsule (500 mg total) by mouth 2 (two) times daily for 7 days. 14 capsule Danton Clap, PA-C      PDMP not reviewed this encounter.   Danton Clap, PA-C 07/25/22 [phone]

## 2022-07-25 NOTE — Discharge Instructions (Addendum)

## 2022-07-25 NOTE — ED Triage Notes (Signed)
Patient c/o dysuria and urinary urgency that started last week.

## 2022-07-28 LAB — URINE CULTURE: Culture: 60000 — AB

## 2023-05-03 ENCOUNTER — Other Ambulatory Visit: Payer: Self-pay | Admitting: Acute Care

## 2023-05-03 DIAGNOSIS — F17210 Nicotine dependence, cigarettes, uncomplicated: Secondary | ICD-10-CM

## 2023-05-03 DIAGNOSIS — Z122 Encounter for screening for malignant neoplasm of respiratory organs: Secondary | ICD-10-CM

## 2023-05-03 DIAGNOSIS — Z87891 Personal history of nicotine dependence: Secondary | ICD-10-CM

## 2023-06-07 ENCOUNTER — Other Ambulatory Visit: Payer: Self-pay | Admitting: Family Medicine

## 2023-06-07 DIAGNOSIS — Z1231 Encounter for screening mammogram for malignant neoplasm of breast: Secondary | ICD-10-CM

## 2023-06-21 ENCOUNTER — Ambulatory Visit: Admission: RE | Admit: 2023-06-21 | Payer: 59 | Source: Ambulatory Visit

## 2024-04-20 ENCOUNTER — Other Ambulatory Visit: Payer: Self-pay | Admitting: Family Medicine

## 2024-04-20 DIAGNOSIS — Z1231 Encounter for screening mammogram for malignant neoplasm of breast: Secondary | ICD-10-CM

## 2024-05-28 ENCOUNTER — Ambulatory Visit
Admission: RE | Admit: 2024-05-28 | Discharge: 2024-05-28 | Disposition: A | Discharge status: Home / Self Care | Source: Ambulatory Visit | Attending: Family Medicine | Admitting: Family Medicine

## 2024-05-28 DIAGNOSIS — Z1231 Encounter for screening mammogram for malignant neoplasm of breast: Secondary | ICD-10-CM | POA: Insufficient documentation

## 2024-06-20 ENCOUNTER — Other Ambulatory Visit: Payer: Self-pay | Admitting: Unknown Physician Specialty

## 2024-06-20 DIAGNOSIS — Z780 Asymptomatic menopausal state: Secondary | ICD-10-CM
